# Patient Record
Sex: Female | Born: 1965 | Race: White | Hispanic: No | Marital: Married | Smoking: Former smoker
Health system: Southern US, Community
[De-identification: ages and names within clinical notes are randomized; demographics above are authoritative.]

## PROBLEM LIST (undated history)

## (undated) DIAGNOSIS — S82009A Unspecified fracture of unspecified patella, initial encounter for closed fracture: Secondary | ICD-10-CM

## (undated) DIAGNOSIS — G43909 Migraine, unspecified, not intractable, without status migrainosus: Secondary | ICD-10-CM

## (undated) DIAGNOSIS — L280 Lichen simplex chronicus: Secondary | ICD-10-CM

## (undated) DIAGNOSIS — N870 Mild cervical dysplasia: Secondary | ICD-10-CM

## (undated) HISTORY — PX: BREAST SURGERY: SHX581

## (undated) HISTORY — DX: Migraine, unspecified, not intractable, without status migrainosus: G43.909

## (undated) HISTORY — PX: BREAST BIOPSY: SHX20

## (undated) HISTORY — PX: GYNECOLOGIC CRYOSURGERY: SHX857

## (undated) HISTORY — DX: Unspecified fracture of unspecified patella, initial encounter for closed fracture: S82.009A

## (undated) HISTORY — DX: Mild cervical dysplasia: N87.0

## (undated) HISTORY — DX: Lichen simplex chronicus: L28.0

---

## 1990-11-14 DIAGNOSIS — N870 Mild cervical dysplasia: Secondary | ICD-10-CM

## 1990-11-14 HISTORY — DX: Mild cervical dysplasia: N87.0

## 1999-10-20 ENCOUNTER — Inpatient Hospital Stay (HOSPITAL_COMMUNITY): Admission: AD | Admit: 1999-10-20 | Discharge: 1999-10-20 | Payer: Self-pay | Admitting: Obstetrics and Gynecology

## 2000-01-03 ENCOUNTER — Inpatient Hospital Stay (HOSPITAL_COMMUNITY): Admission: AD | Admit: 2000-01-03 | Discharge: 2000-01-05 | Payer: Self-pay | Admitting: *Deleted

## 2000-02-14 ENCOUNTER — Other Ambulatory Visit: Admission: RE | Admit: 2000-02-14 | Discharge: 2000-02-14 | Payer: Self-pay | Admitting: Obstetrics and Gynecology

## 2002-06-15 ENCOUNTER — Encounter: Payer: Self-pay | Admitting: Emergency Medicine

## 2002-06-15 ENCOUNTER — Emergency Department (HOSPITAL_COMMUNITY): Admission: EM | Admit: 2002-06-15 | Discharge: 2002-06-15 | Payer: Self-pay | Admitting: Emergency Medicine

## 2003-09-04 ENCOUNTER — Other Ambulatory Visit: Admission: RE | Admit: 2003-09-04 | Discharge: 2003-09-04 | Payer: Self-pay | Admitting: Obstetrics and Gynecology

## 2005-07-26 ENCOUNTER — Encounter: Admission: RE | Admit: 2005-07-26 | Discharge: 2005-07-26 | Payer: Self-pay | Admitting: Obstetrics and Gynecology

## 2005-08-05 ENCOUNTER — Encounter: Admission: RE | Admit: 2005-08-05 | Discharge: 2005-08-05 | Payer: Self-pay | Admitting: Obstetrics and Gynecology

## 2005-08-05 ENCOUNTER — Other Ambulatory Visit: Admission: RE | Admit: 2005-08-05 | Discharge: 2005-08-05 | Payer: Self-pay | Admitting: Obstetrics and Gynecology

## 2006-08-18 ENCOUNTER — Encounter: Admission: RE | Admit: 2006-08-18 | Discharge: 2006-08-18 | Payer: Self-pay | Admitting: Obstetrics and Gynecology

## 2006-08-24 ENCOUNTER — Other Ambulatory Visit: Admission: RE | Admit: 2006-08-24 | Discharge: 2006-08-24 | Payer: Self-pay | Admitting: Obstetrics & Gynecology

## 2007-08-21 ENCOUNTER — Encounter: Admission: RE | Admit: 2007-08-21 | Discharge: 2007-08-21 | Payer: Self-pay | Admitting: Obstetrics and Gynecology

## 2008-01-10 ENCOUNTER — Other Ambulatory Visit: Admission: RE | Admit: 2008-01-10 | Discharge: 2008-01-10 | Payer: Self-pay | Admitting: Obstetrics & Gynecology

## 2008-09-09 ENCOUNTER — Encounter: Admission: RE | Admit: 2008-09-09 | Discharge: 2008-09-09 | Payer: Self-pay | Admitting: Obstetrics & Gynecology

## 2009-09-10 ENCOUNTER — Encounter: Admission: RE | Admit: 2009-09-10 | Discharge: 2009-09-10 | Payer: Self-pay | Admitting: Obstetrics & Gynecology

## 2009-09-16 ENCOUNTER — Encounter: Admission: RE | Admit: 2009-09-16 | Discharge: 2009-09-16 | Payer: Self-pay | Admitting: Orthopedic Surgery

## 2010-12-05 ENCOUNTER — Encounter: Payer: Self-pay | Admitting: Obstetrics & Gynecology

## 2011-04-01 NOTE — Op Note (Signed)
   NAMEDLYNN, RANES                          ACCOUNT NO.:  [id number]   MEDICAL RECORD NO.:  [id number]                   PATIENT TYPE:  EMS   LOCATION:  MAJO                                 FACILITY:  [facility]   PHYSICIAN:  Nicki Reaper, M.D.                 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   PROCEDURE:  Revision with advancement of volar fat pad left middle finger.   SURGEON:  Nicki Reaper, M.D.   ANESTHESIA:  Metacarpal block.   INDICATIONS FOR PROCEDURE:  The patient is a 45 year old female who put her  hand in a Cuisinart, suffering an amputation of the tip of the left middle  finger.  X-ray revealed the amputation to the tip, this was transverse in  nature.   DESCRIPTION OF PROCEDURE:  The patient was given a metacarpal block with 1%  Xylocaine without epinephrine and prepped using Betadine solution.  A  Penrose drain was used for tourniquet control at the base of the finger.  The volar skin was intact, however, it was felt that an advancement of  subcutaneous tissue over the tip with healing would provide a better longer  tip, rather than VY advancement.  As such, the subcutaneous tissue was  undermined.  This was easily advanced over the bone and sutured to the  nailbed with interrupted 5-0 chromic sutures.  Further chromics were placed  to bring the tuft into better alignment.  The wound was irrigated prior to  closure.  A sterile compressive dressing and splint was applied.  She will  return in a week to the office for whirlpool and allow this to granulate and  reepithelialize.  She is discharged on Vicodin and Keflex.                                               Nicki Reaper, M.D.    GRK/MEDQ  D:  06/15/2002  T:  06/20/2002   Job:  [id number]

## 2012-09-27 ENCOUNTER — Other Ambulatory Visit: Payer: Self-pay | Admitting: Family Medicine

## 2012-09-27 ENCOUNTER — Other Ambulatory Visit: Payer: Self-pay | Admitting: Obstetrics & Gynecology

## 2012-09-27 DIAGNOSIS — Z1231 Encounter for screening mammogram for malignant neoplasm of breast: Secondary | ICD-10-CM

## 2012-11-12 ENCOUNTER — Ambulatory Visit
Admission: RE | Admit: 2012-11-12 | Discharge: 2012-11-12 | Disposition: A | Discharge status: Home / Self Care | Payer: Managed Care, Other (non HMO) | Source: Ambulatory Visit | Attending: Family Medicine | Admitting: Family Medicine

## 2012-11-12 DIAGNOSIS — Z1231 Encounter for screening mammogram for malignant neoplasm of breast: Secondary | ICD-10-CM

## 2013-01-30 ENCOUNTER — Telehealth: Payer: Self-pay | Admitting: Obstetrics & Gynecology

## 2013-01-30 MED ORDER — RIZATRIPTAN BENZOATE 5 MG PO TBDP: 5.0000 mg | ORAL_TABLET | ORAL | Status: DC | PRN

## 2013-01-30 MED ORDER — ESTRADIOL 0.05 MG/24HR TD PTTW: 1.0000 | MEDICATED_PATCH | TRANSDERMAL | Status: DC

## 2013-01-30 MED ORDER — PROGESTERONE MICRONIZED 200 MG PO CAPS: 200.0000 mg | ORAL_CAPSULE | Freq: Every day | ORAL | Status: DC

## 2013-01-30 NOTE — Telephone Encounter (Signed)
Called patient personally.  Out of Vivelle dot patches.  Last one used was on Wednesday.  Has a migraine today.  This is a typical migraine for her.  She was on a trip last week.  We tried to call her three times to verify the medications she was taking.  She is aware I personally called the prescriptions to pharmacy on chart.  Vivelle 0.05mg  and Prometrium 200mg .  Also called in RX for Maxalt MLT.  Patient aware how to use this for HA.  Precautions given for going to urgent care or ER if headache does not subside or seems different in any way to her.  Voices understanding.

## 2013-01-30 NOTE — Telephone Encounter (Signed)
Pt. has a severe migraine and vomiting/pt has been without hormones for four days/husband left work to help her/RX was supposed to be called in per husband/[place]

## 2013-01-30 NOTE — Telephone Encounter (Signed)
SEE NOTE BELOW. PLEASE ADVISE. ?MEDICAITON  ?   Jordan Graham

## 2013-01-31 ENCOUNTER — Other Ambulatory Visit: Payer: Self-pay | Admitting: Obstetrics & Gynecology

## 2013-01-31 MED ORDER — ESTRADIOL 0.05 MG/24HR TD PTTW: 1.0000 | MEDICATED_PATCH | TRANSDERMAL | Status: DC

## 2013-01-31 NOTE — Telephone Encounter (Signed)
Called patient.  She put on two patches last night--I had advised her to do this.  She will take one off 48 hours after placement.  Did not need to use the Maxalt MLT.  Her headache is completely gone and she states she feels like a new person.  Very appreciative of my call.  Sample for Vivelle dot 0.05mg  left at front desk for her to pick up.

## 2013-02-05 ENCOUNTER — Encounter: Payer: Self-pay | Admitting: Obstetrics & Gynecology

## 2013-02-05 DIAGNOSIS — G43909 Migraine, unspecified, not intractable, without status migrainosus: Secondary | ICD-10-CM | POA: Insufficient documentation

## 2013-10-18 ENCOUNTER — Telehealth: Payer: Self-pay | Admitting: *Deleted

## 2013-10-18 MED ORDER — PROGESTERONE MICRONIZED 200 MG PO CAPS: 200.0000 mg | ORAL_CAPSULE | Freq: Every day | ORAL | Status: DC

## 2013-10-18 NOTE — Telephone Encounter (Signed)
Rx  Refill request from [facility] for Prometrium 200 mg, take 1 capsule days 1-15 Last annual exam 09/27/12, last mammo 11/12/12 (neg). Annual exam scheduled for 12/2013 Spoke with patient she did not get her Rx for November when she went to pick it up She was told Rx had expired. Advised patient that she would need to have her mammo Done before she comes in for her annual exam 12/2013. Pt was ok with this.

## 2013-11-01 ENCOUNTER — Encounter: Payer: Self-pay | Admitting: Podiatrist

## 2013-11-01 ENCOUNTER — Ambulatory Visit (INDEPENDENT_AMBULATORY_CARE_PROVIDER_SITE_OTHER): Payer: Managed Care, Other (non HMO) | Admitting: Podiatrist

## 2013-11-01 ENCOUNTER — Ambulatory Visit (INDEPENDENT_AMBULATORY_CARE_PROVIDER_SITE_OTHER): Payer: Managed Care, Other (non HMO)

## 2013-11-01 VITALS — BP 132/88 | HR 87 | Resp 16 | Ht 66.0 in | Wt 182.0 lb

## 2013-11-01 DIAGNOSIS — R52 Pain, unspecified: Secondary | ICD-10-CM

## 2013-11-01 DIAGNOSIS — M659 Synovitis and tenosynovitis, unspecified: Secondary | ICD-10-CM

## 2013-11-01 DIAGNOSIS — M7750 Other enthesopathy of unspecified foot: Secondary | ICD-10-CM

## 2013-11-01 NOTE — Patient Instructions (Signed)

## 2013-11-01 NOTE — Progress Notes (Signed)
   Subjective:    Patient ID: Jordan Graham, female    DOB: [birth year], 47 y.o.   MRN: [id number]  "My left foot is very painful from here to here for about 3 months.  It's even more painful the day after I do exercises."  Foot Pain This is a new (5th Metatarsal/ lateral Pain Left) problem. Episode onset: 3 months. The problem occurs constantly. The problem has been gradually worsening. Associated symptoms include fatigue and numbness. Exacerbated by: put pressure on it, touch. Treatments tried: new shoes, wear tennis shoes more frequently or keep my Dansko shoes on. The treatment provided mild relief.      Review of Systems  Constitutional: Positive for fatigue.  HENT: Positive for sinus pressure.   Musculoskeletal: Positive for back pain and gait problem.       Joint pain  Neurological: Positive for numbness.  All other systems reviewed and are negative.       Objective:   Physical Exam  GENERAL APPEARANCE: Alert, conversant. Appropriately groomed. No acute distress.  VASCULAR: Pedal pulses palpable and strong bilateral.  Capillary refill time is immediate to all digits,  Proximal to distal cooling it warm to warm.  Digital hair growth is present bilateral  NEUROLOGIC: sensation is intact epicritically and protectively to 5.07 monofilament at 5/5 sites bilateral.  Light touch is intact bilateral, vibratory sensation intact bilateral, achilles tendon reflex is intact bilateral.  MUSCULOSKELETAL: Metatarsus adductus foot type is noted. Pain along the peroneus brevis and longus tendon at their insertion is noted. Generalized lateral foot discomfort is present.  DERMATOLOGIC: skin color, texture, and turger are within normal limits.  No preulcerative lesions are seen, no interdigital maceration noted.  No open lesions present.  Digital nails are asymptomatic.      Assessment & Plan:  Peroneal tendinitis left foot Plan: Injected the area near the peroneus longus and brevis tendon  with dexamethasone and Marcaine mixture without complication. Removable plantar fascial strapping was applied and the patient was instructed on rest ice compression and elevation. If the injection is of no benefit we will put her in a boot to take the pressure off of this foot. She will call if the injection is of no benefit and we will put her for a boot

## 2013-11-21 ENCOUNTER — Other Ambulatory Visit: Payer: Self-pay

## 2013-11-21 DIAGNOSIS — Z1231 Encounter for screening mammogram for malignant neoplasm of breast: Secondary | ICD-10-CM

## 2013-12-12 ENCOUNTER — Ambulatory Visit
Admission: RE | Admit: 2013-12-12 | Discharge: 2013-12-12 | Disposition: A | Discharge status: Home / Self Care | Payer: 59 | Source: Ambulatory Visit

## 2013-12-12 DIAGNOSIS — Z1231 Encounter for screening mammogram for malignant neoplasm of breast: Secondary | ICD-10-CM

## 2013-12-18 ENCOUNTER — Other Ambulatory Visit: Payer: Self-pay | Admitting: Obstetrics & Gynecology

## 2013-12-18 NOTE — Telephone Encounter (Signed)
Last refilled: 10/28/13 #15/0 refills  Last AEX: 09/27/12 Current AEX: 12/26/13  Okay to refill? Please Advise.

## 2013-12-24 ENCOUNTER — Encounter: Payer: Self-pay | Admitting: Obstetrics & Gynecology

## 2013-12-26 ENCOUNTER — Ambulatory Visit (INDEPENDENT_AMBULATORY_CARE_PROVIDER_SITE_OTHER): Payer: Managed Care, Other (non HMO) | Admitting: Obstetrics & Gynecology

## 2013-12-26 ENCOUNTER — Encounter: Payer: Self-pay | Admitting: Obstetrics & Gynecology

## 2013-12-26 VITALS — BP 124/84 | HR 64 | Resp 16 | Ht 65.75 in | Wt 186.2 lb

## 2013-12-26 DIAGNOSIS — R1310 Dysphagia, unspecified: Secondary | ICD-10-CM

## 2013-12-26 DIAGNOSIS — B9689 Other specified bacterial agents as the cause of diseases classified elsewhere: Secondary | ICD-10-CM

## 2013-12-26 DIAGNOSIS — A499 Bacterial infection, unspecified: Secondary | ICD-10-CM

## 2013-12-26 DIAGNOSIS — N760 Acute vaginitis: Secondary | ICD-10-CM

## 2013-12-26 DIAGNOSIS — Z01419 Encounter for gynecological examination (general) (routine) without abnormal findings: Secondary | ICD-10-CM

## 2013-12-26 DIAGNOSIS — Z0000 Encounter for general adult medical examination without abnormal findings: Secondary | ICD-10-CM

## 2013-12-26 DIAGNOSIS — Z23 Encounter for immunization: Secondary | ICD-10-CM

## 2013-12-26 LAB — POCT URINALYSIS DIPSTICK
BILIRUBIN UA: NEGATIVE
GLUCOSE UA: NEGATIVE
Ketones, UA: NEGATIVE
Nitrite, UA: NEGATIVE
PROTEIN UA: NEGATIVE
Urobilinogen, UA: NEGATIVE
pH, UA: 5

## 2013-12-26 LAB — HEMOGLOBIN, FINGERSTICK: Hemoglobin, fingerstick: 13.8 g/dL (ref 12.0–16.0)

## 2013-12-26 MED ORDER — ESTRADIOL 0.05 MG/24HR TD PTTW: 1.0000 | MEDICATED_PATCH | TRANSDERMAL | Status: DC

## 2013-12-26 MED ORDER — FLUCONAZOLE 150 MG PO TABS: 150.0000 mg | ORAL_TABLET | Freq: Once | ORAL | Status: DC

## 2013-12-26 MED ORDER — METRONIDAZOLE 0.75 % VA GEL: 1.0000 | Freq: Every day | VAGINAL | Status: DC

## 2013-12-26 MED ORDER — PROGESTERONE MICRONIZED 200 MG PO CAPS: ORAL_CAPSULE | ORAL | Status: DC

## 2013-12-26 NOTE — Progress Notes (Signed)
48 y.o. G2P2 MarriedCaucasianF here for annual exam.  Has a placed on left arm that she wants me to feel.  Non tender.  Not in axilla.  Cycles area regular.  Tetanus shot is due.  Will do today.  Cycles have been fairly consistent.  About every six weeks.  Flow is getting shorter.  Last few have been flow for two days and then spotting for two days.  Reports she feels trouble swallowing at times, like food is getting caught in her throat.  New to her.  Patient's last menstrual period was 12/17/2013.          Sexually active: yes  The current method of family planning is vasectomy.    Exercising: yes  walking, elliptical, and body pump Smoker:  Former smoker-in college  Health Maintenance: Pap:  09/27/12 WNL/negative HR HPV History of abnormal Pap:  no MMG:  12/12/13 3D normal Colonoscopy:  11/12 repeat in 7 years BMD:   none TDaP:  8/04 Screening Labs: 2013, Hb today: 13.8, Urine today: WBC-trace, RBC-trace   reports that she quit smoking about 19 years ago. She has never used smokeless tobacco. She reports that she drinks alcohol. She reports that she does not use illicit drugs.  Past Medical History  Diagnosis Date  . Lichen simplex chronicus   . CIN I (cervical intraepithelial neoplasia I) 1992    tx with cryo  . Migraine     with menses    Past Surgical History  Procedure Laterality Date  . Breast surgery    . Gynecologic cryosurgery      Current Outpatient Prescriptions  Medication Sig Dispense Refill  . atomoxetine (STRATTERA) 80 MG capsule Take 100 mg by mouth daily.       [name] estradiol (VIVELLE-DOT) 0.05 MG/24HR Place 1 patch (0.05 mg total) onto the skin 2 (two) times a week.  3 patch  0  . progesterone (PROMETRIUM) 200 MG capsule TAKE ONE CAPSULE BY MOUTH EVERY DAY FOR 15 DAYS EACH MONTH STARTING ON THE FIRST DAY OF EACH MONTH  15 capsule  0  . sertraline (ZOLOFT) 100 MG tablet Take 150 mg by mouth daily.        No current facility-administered medications for this  visit.    Family History  Problem Relation Age of Onset  . Thyroid cancer Father 56  . Diabetes Father   . Hypertension Father   . Bladder Cancer Maternal Grandmother   . Heart attack Maternal Grandmother 72    deceased  . Hypertension Mother   . Pulmonary disease Maternal Grandmother   . Pulmonary disease Paternal Grandfather   . Diverticulitis Paternal Grandmother   . Migraines Mother   . Migraines Brother   . Other Paternal Aunt     mass found during endoscopy-deceased 2 days after procedure while in the hospital    ROS:  Pertinent items are noted in HPI.  Otherwise, a comprehensive ROS was negative.  Exam:   LMP 12/17/2013   Ht Readings from Last 3 Encounters:  11/01/13 5\' 6"  (1.676 m)    General appearance: alert, cooperative and appears stated age Head: Normocephalic, without obvious abnormality, atraumatic Neck: no adenopathy, supple, symmetrical, trachea midline and thyroid normal to inspection and palpation Lungs: clear to auscultation bilaterally Breasts: normal appearance, no masses or tenderness Heart: regular rate and rhythm Abdomen: soft, non-tender; bowel sounds normal; no masses,  no organomegaly Extremities: extremities normal, atraumatic, no cyanosis or edema Skin: Skin color, texture, turgor normal. No rashes or  lesions Lymph nodes: Cervical, supraclavicular, and axillary nodes normal. No abnormal inguinal nodes palpated Neurologic: Grossly normal   Pelvic: External genitalia:  no lesions              Urethra:  normal appearing urethra with no masses, tenderness or lesions              Bartholins and Skenes: normal                 Vagina: normal appearing vagina with normal color, watery discharge with fishy odor, no lesions              Cervix: no lesions              Pap taken: no Bimanual Exam:  Uterus:  normal size, contour, position, consistency, mobility, non-tender              Adnexa: normal adnexa and no mass, fullness, tenderness                Rectovaginal: Confirms               Anus:  normal sphincter tone, no lesions  Wet prep:  Ph 5.0.  KOH with + Whiff, no yeast.  Saline with + clue wells, no trich  A:  Well Woman with normal exam On HRT New onset of difficulty swallowing Vulvar lichen simplex chronicus/lichen sclerosus--no new issues/no itching BV  P:   Mammogram yearly pap smear not indicated.  Neg with Neg HR HPV 2013 TSH, CMP Thyroid u/s to be scheduled Metrogel rx to pharmacy Continue Vivelle dot and Prometrium rx to pharmacy Tdap due return annually or prn  An After Visit Summary was printed and given to the patient.

## 2013-12-27 LAB — COMPREHENSIVE METABOLIC PANEL
ALK PHOS: 62 U/L (ref 39–117)
ALT: 16 U/L (ref 0–35)
AST: 17 U/L (ref 0–37)
Albumin: 4.6 g/dL (ref 3.5–5.2)
BUN: 15 mg/dL (ref 6–23)
CHLORIDE: 103 meq/L (ref 96–112)
CO2: 28 mEq/L (ref 19–32)
CREATININE: 0.57 mg/dL (ref 0.50–1.10)
Calcium: 9.5 mg/dL (ref 8.4–10.5)
GLUCOSE: 83 mg/dL (ref 70–99)
POTASSIUM: 4.3 meq/L (ref 3.5–5.3)
SODIUM: 140 meq/L (ref 135–145)
Total Bilirubin: 0.3 mg/dL (ref 0.2–1.2)
Total Protein: 7 g/dL (ref 6.0–8.3)

## 2013-12-27 LAB — TSH: TSH: 2.13 u[IU]/mL (ref 0.350–4.500)

## 2014-01-03 ENCOUNTER — Telehealth: Payer: Self-pay | Admitting: *Deleted

## 2014-01-03 NOTE — Progress Notes (Signed)
LMTCB to discuss lab results and scheduling thyroid U/S.

## 2014-01-03 NOTE — Addendum Note (Signed)
Addended by: Joeseph AmorFAST, Anastazja Isaac L on: 01/03/2014 09:11 AM   Modules accepted: Orders

## 2014-01-03 NOTE — Telephone Encounter (Signed)
[facility] patient [facility] @ 1315. Patient agreeable.

## 2014-01-08 ENCOUNTER — Ambulatory Visit
Admission: RE | Admit: 2014-01-08 | Discharge: 2014-01-08 | Disposition: A | Discharge status: Home / Self Care | Payer: 59 | Source: Ambulatory Visit | Attending: Obstetrics & Gynecology | Admitting: Obstetrics & Gynecology

## 2014-01-08 DIAGNOSIS — R1310 Dysphagia, unspecified: Secondary | ICD-10-CM

## 2014-01-13 ENCOUNTER — Telehealth: Payer: Self-pay | Admitting: Emergency Medicine

## 2014-01-13 NOTE — Telephone Encounter (Signed)
Message left to return call to [facility] at [phone] for a message from Dr. Hyacinth MeekerMiller.   Left message to advise of normal results of ultrasound, okay per release of information. Advised if continues with dificulty swallowing, to call us and next step is referral to ENT.

## 2014-01-13 NOTE — Telephone Encounter (Signed)
Message copied by Joeseph AmorFAST, Carlissa Pesola L on Mon Jan 13, 2014  8:26 AM ------      Message from: Jerene BearsMILLER, MARY S      Created: Thu Jan 09, 2014  9:45 PM       Please inform Mrs. Mendibles thyroid is normal except for a very small, 4mm nodule.  Nothing needs to be done about this.  If the trouble swallowing continues, we need to refer her to ENT. ------

## 2014-01-28 ENCOUNTER — Other Ambulatory Visit: Payer: Self-pay | Admitting: *Deleted

## 2014-01-28 NOTE — Telephone Encounter (Signed)
Error

## 2014-06-27 ENCOUNTER — Telehealth: Payer: Self-pay | Admitting: Obstetrics & Gynecology

## 2014-06-27 NOTE — Telephone Encounter (Addendum)
[facility] F[facility]ed back with note: Rx sent 12/26/13 #24 patch/ 4 refills. She should have enough refills to last until 12/2014

## 2014-06-27 NOTE — Telephone Encounter (Signed)
Pt requesting refill on estradiol sent to [facility] at [phone].

## 2014-06-30 NOTE — Telephone Encounter (Signed)
S/w pharmacist they do have rx from 12/26/13 and patient has already picked up a refill.  Routed to provider for review, encounter closed.

## 2014-09-15 ENCOUNTER — Encounter: Payer: Self-pay | Admitting: Obstetrics & Gynecology

## 2014-10-28 ENCOUNTER — Other Ambulatory Visit: Payer: Self-pay | Admitting: Nurse Practitioner

## 2014-11-06 ENCOUNTER — Emergency Department (HOSPITAL_COMMUNITY): Payer: Medicare HMO

## 2014-11-06 ENCOUNTER — Encounter (HOSPITAL_COMMUNITY): Payer: Self-pay

## 2014-11-06 ENCOUNTER — Emergency Department (HOSPITAL_COMMUNITY)
Admission: EM | Admit: 2014-11-06 | Discharge: 2014-11-06 | Disposition: A | Discharge status: Home / Self Care | Payer: Medicare HMO | Attending: Emergency Medicine | Admitting: Emergency Medicine

## 2014-11-06 DIAGNOSIS — Y9301 Activity, walking, marching and hiking: Secondary | ICD-10-CM | POA: Insufficient documentation

## 2014-11-06 DIAGNOSIS — W010XXA Fall on same level from slipping, tripping and stumbling without subsequent striking against object, initial encounter: Secondary | ICD-10-CM | POA: Diagnosis not present

## 2014-11-06 DIAGNOSIS — Z8669 Personal history of other diseases of the nervous system and sense organs: Secondary | ICD-10-CM | POA: Diagnosis not present

## 2014-11-06 DIAGNOSIS — Z79899 Other long term (current) drug therapy: Secondary | ICD-10-CM | POA: Insufficient documentation

## 2014-11-06 DIAGNOSIS — S82031A Displaced transverse fracture of right patella, initial encounter for closed fracture: Secondary | ICD-10-CM | POA: Diagnosis not present

## 2014-11-06 DIAGNOSIS — Y998 Other external cause status: Secondary | ICD-10-CM | POA: Insufficient documentation

## 2014-11-06 DIAGNOSIS — S82001A Unspecified fracture of right patella, initial encounter for closed fracture: Secondary | ICD-10-CM

## 2014-11-06 DIAGNOSIS — Y92410 Unspecified street and highway as the place of occurrence of the external cause: Secondary | ICD-10-CM | POA: Insufficient documentation

## 2014-11-06 DIAGNOSIS — S8991XA Unspecified injury of right lower leg, initial encounter: Secondary | ICD-10-CM | POA: Diagnosis present

## 2014-11-06 DIAGNOSIS — Z8742 Personal history of other diseases of the female genital tract: Secondary | ICD-10-CM | POA: Diagnosis not present

## 2014-11-06 DIAGNOSIS — Z872 Personal history of diseases of the skin and subcutaneous tissue: Secondary | ICD-10-CM | POA: Diagnosis not present

## 2014-11-06 DIAGNOSIS — Z87891 Personal history of nicotine dependence: Secondary | ICD-10-CM | POA: Insufficient documentation

## 2014-11-06 DIAGNOSIS — S82009A Unspecified fracture of unspecified patella, initial encounter for closed fracture: Secondary | ICD-10-CM

## 2014-11-06 HISTORY — DX: Unspecified fracture of unspecified patella, initial encounter for closed fracture: S82.009A

## 2014-11-06 MED ORDER — IBUPROFEN 600 MG PO TABS: 600.0000 mg | ORAL_TABLET | Freq: Four times a day (QID) | ORAL | Status: DC | PRN

## 2014-11-06 MED ORDER — HYDROCODONE-ACETAMINOPHEN 5-325 MG PO TABS: 1.0000 | ORAL_TABLET | Freq: Four times a day (QID) | ORAL | Status: DC | PRN

## 2014-11-06 NOTE — ED Provider Notes (Signed)
CSN: [id number]     Arrival date & time 11/06/14  1710 History  This chart was scribed for non-physician practitioner, Junius Finner, PA-C,working with Mirian Mo, MD, by Karle Plumber, ED Scribe. This patient was seen in room TR06C/TR06C and the patient's care was started at 5:37 PM.  Chief Complaint  Patient presents with  . Knee Pain   Patient is a 48 y.o. female presenting with knee pain. The history is provided by the patient. No language interpreter was used.  Knee Pain   HPI Comments:  ORIS CALMES is a 48 y.o. female who presents to the Emergency Department complaining of moderate right anterior knee pain secondary to falling approximately one hour ago. Pt reports associated redness and swelling of the knee. She states she was crossing a street and tripped and fell with all her weight to the right knee. Bearing weight and extension of the knee makes the pain worse. Resting the knee helps to alleviate the pain. She has not taken anything to treat her pain. Denies numbness, tingling or weakness of the RLE. Denies head injury, LOC, right ankle or hip injury or any other injury.  Past Medical History  Diagnosis Date  . Lichen simplex chronicus   . CIN I (cervical intraepithelial neoplasia I) 1992    tx with cryo  . Migraine     with menses   Past Surgical History  Procedure Laterality Date  . Breast surgery    . Gynecologic cryosurgery     Family History  Problem Relation Age of Onset  . Thyroid cancer Father 22  . Diabetes Father   . Hypertension Father   . Bladder Cancer Maternal Grandmother   . Heart attack Maternal Grandmother 72    deceased  . Hypertension Mother   . Pulmonary disease Maternal Grandmother   . Pulmonary disease Paternal Grandfather   . Diverticulitis Paternal Grandmother   . Migraines Mother   . Migraines Brother   . Other Paternal Aunt     mass found during endoscopy-deceased 2 days after procedure while in the hospital   History   Substance Use Topics  . Smoking status: Former Smoker    Quit date: 11/14/1994  . Smokeless tobacco: Never Used  . Alcohol Use: Yes     Comment: wine-occ   OB History    Gravida Para Term Preterm AB TAB SAB Ectopic Multiple Living   2 2        2       Obstetric Comments   And 3 stepchildren     Review of Systems  Musculoskeletal: Positive for joint swelling.  Neurological: Negative for syncope, weakness and numbness.  All other systems reviewed and are negative.   Allergies  Review of patient's allergies indicates no known allergies.  Home Medications   Prior to Admission medications   Medication Sig Start Date End Date Taking? Authorizing Provider  atomoxetine (STRATTERA) 80 MG capsule Take 100 mg by mouth daily.    Yes Historical Provider, MD  estradiol (VIVELLE-DOT) 0.05 MG/24HR patch Place 1 patch (0.05 mg total) onto the skin 2 (two) times a week. 12/26/13  Yes Annamaria Boots, MD  progesterone (PROMETRIUM) 200 MG capsule TAKE ONE CAPSULE BY MOUTH EVERY DAY FOR 15 DAYS EACH MONTH STARTING ON THE FIRST DAY OF [facility] MONTH 12/26/13  Yes Annamaria Boots, MD  sertraline (ZOLOFT) 100 MG tablet Take 150 mg by mouth daily.    Yes Historical Provider, MD  fluconazole (DIFLUCAN) 150 MG tablet Take  1 tablet (150 mg total) by mouth once. Repeat in 72 hours Patient not taking: Reported on 11/06/2014 12/26/13   Annamaria BootsMary Suzanne Miller, MD  HYDROcodone-acetaminophen (NORCO/VICODIN) 5-325 MG per tablet Take 1-2 tablets by mouth every 6 (six) hours as needed for moderate pain or severe pain. 11/06/14   Junius FinnerErin O'Malley, PA-C  ibuprofen (ADVIL,MOTRIN) 600 MG tablet Take 1 tablet (600 mg total) by mouth every 6 (six) hours as needed. 11/06/14   Junius FinnerErin O'Malley, PA-C  metroNIDAZOLE (METROGEL) 0.75 % vaginal gel Place 1 Applicatorful vaginally at bedtime. Use for 5 days Patient not taking: Reported on 11/06/2014 12/26/13   Annamaria BootsMary Suzanne Miller, MD   Triage Vitals: BP 136/90 mmHg  Pulse 97   Temp(Src) 98.2 F (36.8 C) (Oral)  Resp 18  SpO2 100%  LMP 11/03/2014 (Exact Date) Physical Exam  Constitutional: She is oriented to person, place, and time. She appears well-developed and well-nourished.  HENT:  Head: Normocephalic and atraumatic.  Eyes: EOM are normal.  Neck: Normal range of motion.  Cardiovascular: Normal rate.   DP pulses intact.  Pulmonary/Chest: Effort normal.  Musculoskeletal: Normal range of motion. She exhibits tenderness. She exhibits no edema.  Right knee with no obvious deformity, no edema. Erythema and ecchymosis over patella with tenderness. No tender to medial or lateral aspect of knee. Unable to bear weight. Increased pain with full knee extension.  Neurological: She is alert and oriented to person, place, and time.  Sensations intact.  Skin: Skin is warm and dry.  Skin in tact.  Psychiatric: She has a normal mood and affect. Her behavior is normal.  Nursing note and vitals reviewed.   ED Course  Procedures (including critical care time) DIAGNOSTIC STUDIES: Oxygen Saturation is 100% on RA, normal by my interpretation.   COORDINATION OF CARE: 5:40 PM- Will X-Ray right knee. Offered pain medication but pt declined. Pt verbalizes understanding and agrees to plan.  Medications - No data to display  Labs Review Labs Reviewed - No data to display  Imaging Review Dg Knee Complete 4 Views Right  11/06/2014   CLINICAL DATA:  Tripped and fell while walking across street, anterior RIGHT knee pain  EXAM: RIGHT KNEE - COMPLETE 4+ VIEW  COMPARISON:  None  FINDINGS: Question mild osseous demineralization.  Joint spaces preserved.  Transverse fracture at inferior pole patella, distracted 3 mm.  No additional fracture, dislocation or bone destruction.  No knee joint effusion.  IMPRESSION: Transverse fracture inferior pole RIGHT patella distracted 3 mm.   Electronically Signed   By: Ulyses SouthwardMark  Boles M.D.   On: 11/06/2014 19:05     EKG Interpretation None       MDM   Final diagnoses:  Right patella fracture, closed, initial encounter  Fall from slip, trip, or stumble, initial encounter    Pt c/o right knee pain after trip and fall just PTA. Denies hitting head or LOC. No other injuries. Right leg is neurovascularly in tact. Tenderness over patella.  No medial or lateral joint tenderness. No edema or deformity. Unable to bear weight and severe pain with full knee extension, however able to fully extend knee.   Discussed pt with Dr. Littie DeedsGentry.  Consulted with Dr. Ophelia CharterYates, orthopedics.  Pt may be discharged home in knee immobilizer and crutches.  Pt is to call to schedule f/u appointment with Dr. Ophelia CharterYates on Tuesday, 12/29, however, pt states she has been seen by Dr. Eulah PontMurphy and Thurston HoleWainer, orthopedics in the past. Advised pt is allowed to call her previously established  orthopedist to see if she can f/u with them next week. Home care instructions provided. Rx: norco and ibuprofen. Return precautions provided. Pt verbalized understanding and agreement with tx plan.   I personally performed the services described in this documentation, which was scribed in my presence. The recorded information has been reviewed and is accurate.    Junius Finnerrin O'Malley, PA-C 11/06/14 2010  Mirian MoMatthew Gentry, MD 11/10/14 1400

## 2014-11-06 NOTE — Progress Notes (Signed)
Orthopedic Tech Progress Note Patient Details:  Vic BlackbirdVanessa P Ruppel 1966/02/28 [id number]  Ortho Devices Type of Ortho Device: Knee Immobilizer, Crutches Ortho Device/Splint Location: RLE Ortho Device/Splint Interventions: Ordered, Application   Jennye MoccasinHughes, Terion Hedman Craig 11/06/2014, 7:57 PM

## 2014-11-06 NOTE — ED Notes (Signed)
Pt states that an hour ago she was walking crossing the street and tripped and fell and her weight shifted onto her knee cap causing knee injury. Swelling noted to right knee. Pt states that pain is 3/10 at rest and 10/10 when bearing weight on right knee.

## 2014-11-06 NOTE — ED Notes (Signed)
Called Radiology about delay. They report the xray rooms are full, but she is next on the list. Updated patient and provider.

## 2014-11-17 ENCOUNTER — Telehealth: Payer: Self-pay | Admitting: Obstetrics & Gynecology

## 2014-11-17 ENCOUNTER — Other Ambulatory Visit: Payer: Self-pay

## 2014-11-17 DIAGNOSIS — Z1231 Encounter for screening mammogram for malignant neoplasm of breast: Secondary | ICD-10-CM

## 2014-11-17 NOTE — Telephone Encounter (Signed)
Pt wants to talk with nurse concerning her Vivelle dot. She picked up her prescription on 12/202015 and was charged $ 82.99 for it. She states the pharmacist told her it was because Dr. Hyacinth Meeker was listed as a nutritionist and not a MD. Patient wants to know what is going on because this is bizarre.[name]

## 2014-11-18 NOTE — Telephone Encounter (Signed)
Call to patient. Husband Jordan Graham answered line and said he knows about message, Jordan Graham gave details about issue with pharmacy. Advised I spoke with [facility] employee, Jordan Graham who has made the change under provider to Jordan BootsMary Suzanne Miller, MD and that there is a refund available. Advised them to call [facility] directly if has any questions.   Okay to speak with Jordan Graham per designated party release form.   Routing to provider for final review. Patient agreeable to disposition. Will close encounter

## 2014-12-15 ENCOUNTER — Ambulatory Visit: Payer: 59

## 2015-01-02 ENCOUNTER — Ambulatory Visit (INDEPENDENT_AMBULATORY_CARE_PROVIDER_SITE_OTHER): Payer: Managed Care, Other (non HMO) | Admitting: Obstetrics & Gynecology

## 2015-01-02 ENCOUNTER — Encounter: Payer: Self-pay | Admitting: Obstetrics & Gynecology

## 2015-01-02 VITALS — BP 118/70 | HR 60 | Resp 16 | Ht 66.0 in | Wt 185.2 lb

## 2015-01-02 DIAGNOSIS — Z0000 Encounter for general adult medical examination without abnormal findings: Secondary | ICD-10-CM

## 2015-01-02 DIAGNOSIS — Z01419 Encounter for gynecological examination (general) (routine) without abnormal findings: Secondary | ICD-10-CM

## 2015-01-02 DIAGNOSIS — Z124 Encounter for screening for malignant neoplasm of cervix: Secondary | ICD-10-CM

## 2015-01-02 LAB — POCT URINALYSIS DIPSTICK
Bilirubin, UA: NEGATIVE
Glucose, UA: NEGATIVE
KETONES UA: NEGATIVE
Leukocytes, UA: NEGATIVE
Nitrite, UA: NEGATIVE
PROTEIN UA: NEGATIVE
UROBILINOGEN UA: NEGATIVE
pH, UA: 5

## 2015-01-02 LAB — LIPID PANEL
CHOLESTEROL: 162 mg/dL (ref 0–200)
HDL: 65 mg/dL (ref >39)
LDL Cholesterol: 82 mg/dL (ref 0–99)
Total CHOL/HDL Ratio: 2.5 Ratio
Triglycerides: 74 mg/dL (ref <150)
VLDL: 15 mg/dL (ref 0–40)

## 2015-01-02 MED ORDER — ESTRADIOL 0.05 MG/24HR TD PTTW: 1.0000 | MEDICATED_PATCH | TRANSDERMAL | Status: DC

## 2015-01-02 MED ORDER — PROGESTERONE MICRONIZED 200 MG PO CAPS: ORAL_CAPSULE | ORAL | Status: DC

## 2015-01-02 NOTE — Addendum Note (Signed)
Addended by: Elisha HeadlandNIX, Keonta Monceaux S on: 01/02/2015 03:33 PM   Modules accepted: Orders, SmartSet

## 2015-01-02 NOTE — Progress Notes (Addendum)
49 y.o. G2P2 MarriedCaucasianF here for annual exam.  Cycles are less and less.  Skipped three months.  Then has three in the last 8 weeks.  Last cycle was heavy and lasted 3-4 days.  No clots.  One of these was very very light.  Reports she messed up her progesterone in December.    Pt reports she is having a lot more issues with incontinence--with sneezing, laughing, coughing.  Ready to proceed with treatment.    Patient's last menstrual period was 12/16/2014.          Sexually active: Yes.    The current method of family planning is vasectomy.    Exercising: Yes.    strength training Smoker:  Former smoker-only for 1 year  Health Maintenance: Pap:  09/27/12 WNL/negative HR HPV History of abnormal Pap:  no MMG:  12/12/13 3D-normal, scheduled this month Colonoscopy:  11/12-repeat in 7 years BMD:   none TDaP:  12/26/13 Screening Labs: lipids/tsh, Hb today: n/a, Urine today: tr leuk, tr rbc   reports that she quit smoking about 20 years ago. She has never used smokeless tobacco. She reports that she drinks alcohol. She reports that she does not use illicit drugs.  Past Medical History  Diagnosis Date  . Lichen simplex chronicus   . CIN I (cervical intraepithelial neoplasia I) 1992    tx with cryo  . Migraine     with menses    Past Surgical History  Procedure Laterality Date  . Breast surgery    . Gynecologic cryosurgery      Current Outpatient Prescriptions  Medication Sig Dispense Refill  . estradiol (VIVELLE-DOT) 0.05 MG/24HR patch Place 1 patch (0.05 mg total) onto the skin 2 (two) times a week. 24 patch 4  . progesterone (PROMETRIUM) 200 MG capsule TAKE ONE CAPSULE BY MOUTH EVERY DAY FOR 15 DAYS EACH MONTH STARTING ON THE FIRST DAY OF EACH MONTH 45 capsule 4  . sertraline (ZOLOFT) 100 MG tablet Take 150 mg by mouth daily.     [name] STRATTERA 100 MG capsule   0   No current facility-administered medications for this visit.    Family History  Problem Relation Age of Onset   . Thyroid cancer Father 7  . Diabetes Father   . Hypertension Father   . Bladder Cancer Maternal Grandmother   . Heart attack Maternal Grandmother 72    deceased  . Hypertension Mother   . Pulmonary disease Maternal Grandmother   . Pulmonary disease Paternal Grandfather   . Diverticulitis Paternal Grandmother   . Migraines Mother   . Migraines Brother   . Other Paternal Aunt     mass found during endoscopy-deceased 2 days after procedure while in the hospital    ROS:  Pertinent items are noted in HPI.  Otherwise, a comprehensive ROS was negative.  Exam:   BP 118/70 mmHg  Pulse 60  Resp 16  Ht  (1.676 m)  Wt 185 lb 3.2 oz (84.006 kg)  BMI 29.91 kg/m2  LMP 12/16/2014    Height:  (167.6 cm)  Ht Readings from Last 3 Encounters:  01/02/15  (1.676 m)  11/06/14  (1.676 m)  12/26/13 5' 5.75" (1.67 m)    General appearance: alert, cooperative and appears stated age Head: Normocephalic, without obvious abnormality, atraumatic Neck: no adenopathy, supple, symmetrical, trachea midline and thyroid normal to inspection and palpation Lungs: clear to auscultation bilaterally Breasts: normal appearance, no masses or tenderness Heart: regular rate and rhythm  Abdomen: soft, non-tender; bowel sounds normal; no masses,  no organomegaly Extremities: extremities normal, atraumatic, no cyanosis or edema Skin: Skin color, texture, turgor normal. No rashes or lesions Lymph nodes: Cervical, supraclavicular, and axillary nodes normal. No abnormal inguinal nodes palpated Neurologic: Grossly normal   Pelvic: External genitalia:  no lesions, thickened white lesion to right of clitoris and labium minora              Urethra:  normal appearing urethra with no masses, tenderness or lesions              Bartholins and Skenes: normal                 Vagina: normal appearing vagina with normal color and discharge, no lesions              Cervix: no lesions              Pap taken:  Yes.   Bimanual Exam:  Uterus:  normal size, contour, position, consistency, mobility, non-tender              Adnexa: normal adnexa and no mass, fullness, tenderness               Rectovaginal: Confirms               Anus:  normal sphincter tone, no lesions  Chaperone was present for exam.  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  Lesion stable on exam. 4mm thyroid nodule.  Family hx of thyroid cancer in her father. DUB most likely due to taking progesterone incorrectly last month. SUI  P: Mammogram yearly .  Pt knows she missed MMG appt.  Neg HR HPV with neg pap 2013.  Pap obtained today. Continue Vivelle dot and Prometrium rx to pharmacy.  Pt will call and let me know if irregular bleeding continues Repeat thyroid ultrasound one year TSH and lipids today. Plan appt with Dr. Edward JollySilva.  Will need urodynamics. return annually or prn

## 2015-01-02 NOTE — Addendum Note (Signed)
Addended by: Jerene BearsMILLER, Haille Pardi S on: 01/02/2015 02:46 PM   Modules accepted: Kipp BroodSmartSet

## 2015-01-03 LAB — TSH: TSH: 1.453 u[IU]/mL (ref 0.350–4.500)

## 2015-01-03 NOTE — Addendum Note (Signed)
Addended by: Jerene BearsMILLER, Alizey Noren S on: 01/03/2015 06:07 AM   Modules accepted: Kipp BroodSmartSet

## 2015-01-06 LAB — IPS PAP TEST WITH REFLEX TO HPV

## 2015-01-07 ENCOUNTER — Other Ambulatory Visit: Payer: Self-pay

## 2015-01-07 NOTE — Telephone Encounter (Signed)
Lmtcb//kn 

## 2015-01-07 NOTE — Telephone Encounter (Signed)
-----   Message from QuintanaBrook E Amundson de Gwenevere Ghaziarvalho E Silva, MD sent at 01/06/2015 10:24 PM EST ----- Pap is normal.  It did show bacterial vaginosis.  I usually do not treat unless patient is symptomatic or desires treatment for this.  I would offer her Flagyl 500 mg po bid for 7 nights.  Do not mix with alcohol.  No pap recall needed.

## 2015-01-08 ENCOUNTER — Other Ambulatory Visit: Payer: Self-pay | Admitting: Obstetrics & Gynecology

## 2015-01-08 MED ORDER — METRONIDAZOLE 500 MG PO TABS: 500.0000 mg | ORAL_TABLET | Freq: Two times a day (BID) | ORAL | Status: DC

## 2015-01-08 NOTE — Telephone Encounter (Signed)
Call to patient. Advised of normal TSH and cholesterol results. (see result note from Dr Hyacinth MeekerMiller). Notified of normal pap result per Dr Edward JollySilva. Also shows BV. Patient initially denied any vaginal symptoms.  Then reports she has had some light bleeding and odor since was here for appointment. Now only pink discharge. Advised should proceed with treatment of BV as Dr Edward JollySilva recommend. Alcohol precautions given while on medication and for three days following. Discussed Dr Rondel BatonMiller's recommendation for consult with Dr Edward JollySilva for SUI. Patient agreeable and appointment scheduled for 01-14-15 at 1000. Aware to expect pelvic exam. Brief explantion of possible urodynamic testing to be determined by Dr Edward JollySilva. See result noted from Dr Hyacinth MeekerMiller for these orders.

## 2015-01-14 ENCOUNTER — Encounter: Payer: Self-pay | Admitting: Obstetrics and Gynecology

## 2015-01-14 ENCOUNTER — Ambulatory Visit (INDEPENDENT_AMBULATORY_CARE_PROVIDER_SITE_OTHER): Payer: Managed Care, Other (non HMO) | Admitting: Obstetrics and Gynecology

## 2015-01-14 VITALS — BP 128/78 | HR 88 | Ht 66.0 in | Wt 184.4 lb

## 2015-01-14 DIAGNOSIS — N393 Stress incontinence (female) (male): Secondary | ICD-10-CM | POA: Diagnosis not present

## 2015-01-14 NOTE — Progress Notes (Signed)
Patient ID: Jordan Graham, female   DOB: 12-13-1965, 49 y.o.   MRN: [id number] GYNECOLOGY VISIT  PCP:  Maryelizabeth RowanElizabeth Dewey, MD  Referring provider:  Leda QuailSuzanne Miller, MD  HPI: 49 y.o.   Married  Caucasian  female   G2P2 with Patient's last menstrual period was 01/02/2015 (exact date).   here for evaluation of stress urinary incontinence.    Leaks urine with cough, laugh, yelling, and sneezing for a few years, worsening now.  Not an every day occurrence.  Leaks with exercise with jumping and trampoline.  Wears a pad.  Voids often to avoid leakage.   No leakage for no reason.  No key in lock or urge with washing hands. No urgency. DF - every 1 - 3 hours. NF - none.  No Enuresis.   Can leak as she is preparing to void.  Kegel will not hold the urine.  Position changes to empty bladder completely.   No hematuria or dysuria.  No history of UTIs or pyelonephritis or renal stones.   Vaginal deliveries - 7 pounds, 6 ounces with fourth degree tear and 7 pounds 11 ounces.  No operative vaginal delivery.   Some fecal urgency.  Some constipation issues.  No splinting.   Urine:  Negative.  GYNECOLOGIC HISTORY: Patient's last menstrual period was 01/02/2015 (exact date). Sexually active: yes  Partner preference: female Contraception:  vasectomy  Menopausal hormone therapy: n/a DES exposure: no   Blood transfusions: no   Sexually transmitted diseases:  HPV GYN procedures and prior surgeries: Lt. Breast biopsy--benign, cryotherapy to cervix in her 20's. Last mammogram: 12-13-13 fibroglandular density/nl:[facility].  Patient is scheduled for screening mammogram.               Last pap and high risk HPV testing: 01-02-15 wnl:no HPV testing--neg 2013.    History of abnormal pap smear:  Hx cryotherapy to cervix in her 3520's   OB History    Gravida Para Term Preterm AB TAB SAB Ectopic Multiple Living   2 2        2       Obstetric Comments   And 3 stepchildren        LIFESTYLE: Exercise:   Strength training           Tobacco:  no Alcohol:    no Drug use:  no  Patient Active Problem List   Diagnosis Date Noted  . Migraines 02/05/2013    Past Medical History  Diagnosis Date  . Lichen simplex chronicus   . CIN I (cervical intraepithelial neoplasia I) 1992    tx with cryo  . Migraine     with menses  . Patella fracture 11/06/14    broken patella-just released from PT    Past Surgical History  Procedure Laterality Date  . Breast surgery    . Gynecologic cryosurgery      Current Outpatient Prescriptions  Medication Sig Dispense Refill  . estradiol (VIVELLE-DOT) 0.05 MG/24HR patch Place 1 patch (0.05 mg total) onto the skin 2 (two) times a week. 24 patch 4  . metroNIDAZOLE (FLAGYL) 500 MG tablet Take 1 tablet (500 mg total) by mouth 2 (two) times daily. 14 tablet 0  . progesterone (PROMETRIUM) 200 MG capsule TAKE ONE CAPSULE BY MOUTH EVERY DAY FOR 15 DAYS EACH MONTH STARTING ON THE FIRST DAY OF EACH MONTH 45 capsule 4  . sertraline (ZOLOFT) 100 MG tablet Take 150 mg by mouth daily.     [name]. STRATTERA 100 MG capsule  0   No current facility-administered medications for this visit.     ALLERGIES: Review of patient's allergies indicates no known allergies.  Family History  Problem Relation Age of Onset  . Thyroid cancer Father 62  . Diabetes Father   . Hypertension Father   . Bladder Cancer Maternal Grandmother   . Heart attack Maternal Grandmother 72    deceased  . Hypertension Mother   . Pulmonary disease Maternal Grandmother   . Pulmonary disease Paternal Grandfather   . Diverticulitis Paternal Grandmother   . Migraines Mother   . Migraines Brother   . Other Paternal Aunt     mass found during endoscopy-deceased 2 days after procedure while in the hospital    History   Social History  . Marital Status: Married    Spouse Name: N/A  . Number of Children: N/A  . Years of Education: N/A   Occupational History  . Not on  file.   Social History Main Topics  . Smoking status: Former Smoker    Quit date: 11/14/1994  . Smokeless tobacco: Never Used  . Alcohol Use: 0.0 oz/week    0 Standard drinks or equivalent per week     Comment: maybe once a quarter  . Drug Use: No  . Sexual Activity:    Partners: Male    Birth Control/ Protection: Other-see comments     Comment: vasectomy   Other Topics Concern  . Not on file   Social History Narrative    ROS:  Pertinent items are noted in HPI.  PHYSICAL EXAMINATION:    BP 128/78 mmHg  Pulse 88  Ht  (1.676 m)  Wt 184 lb 6.4 oz (83.643 kg)  BMI 29.78 kg/m2  LMP 01/02/2015 (Exact Date)   Wt Readings from Last 3 Encounters:  01/14/15 184 lb 6.4 oz (83.643 kg)  01/02/15 185 lb 3.2 oz (84.006 kg)  11/06/14 175 lb (79.379 kg)     Ht Readings from Last 3 Encounters:  01/14/15  (1.676 m)  01/02/15  (1.676 m)  11/06/14  (1.676 m)    General appearance: alert, cooperative and appears stated age  Abdomen: soft, non-tender; no masses,  no organomegaly Extremities: extremities normal, atraumatic, no cyanosis or edema No abnormal inguinal nodes palpated Neurologic: Grossly normal  Pelvic: External genitalia:  no lesions              Urethra:  normal appearing urethra with no masses, tenderness or lesions              Bartholins and Skenes: normal                 Vagina: normal appearing vagina with normal color and discharge, no lesions              Cervix: normal appearance                 Bimanual Exam:  Uterus:  uterus is normal size, shape, consistency and nontender                                      Adnexa: normal adnexa in size, nontender and no masses                                      Rectovaginal: Confirms  Anus:  normal sphincter tone, no lesions  ASSESSMENT  Genuine stress incontinence. History of fourth degree laceration.  Some fecal urgency. Good anatomic support of the pelvic  floor.  PLAN  Comprehensive discussion of genuine stress incontinence and physical therapy versus midurethral sling/cystoscopy for treatment.   Discussion of urodynamic testing prior to surgery.  Procedure reviewed.  Patient wishes to proceed.  Midurethral sling discussed in detail including procedure itself, efficacy of 85 - 90%, and risks which include but are not limited to bleeding; infection; damage to surrounding organs; reaction to anesthesia; DVT; PE; death; cystotomy; urinary retention requiring prolonged catheterization self catheterization or loosening/surgical removal of the sling; mesh erosion and exposure leading to pain and need for surgical removal; slower voiding; de novo urinary urgency requiring medication; and urinary tract infection.   Patient understands that the material used for the midurethral sling is a permanent mesh material.  Discussion of surgical recovery and expectations.  No work for 1 - 2 weeks.  No lifting over 10 pounds and no sexual activity for 8 weeks post op.  ACOG handouts on urinary incontinence and surgical care for urinary incontinence to patient.  An After Visit Summary was printed and given to the patient.  40 minutes face to face time of which over 50% was spent in counseling.

## 2015-01-16 ENCOUNTER — Telehealth: Payer: Self-pay | Admitting: Obstetrics and Gynecology

## 2015-01-16 NOTE — Telephone Encounter (Signed)
Left message for patient to call back. Need to go over benefits for and schedule urodynamics.

## 2015-01-20 NOTE — Telephone Encounter (Signed)
Spoke with patient. Advised of oop expectation for urodynamics. Patient agreeable. Patient will call back to schedule.

## 2015-01-29 NOTE — Telephone Encounter (Signed)
Call to patient to further discuss scheduling of urodynamics. Left message for patient to call back

## 2015-02-10 NOTE — Telephone Encounter (Signed)
Made several attempts to contact patient. Phone rings busy.

## 2015-03-04 ENCOUNTER — Telehealth: Payer: Self-pay | Admitting: Obstetrics and Gynecology

## 2015-03-04 NOTE — Telephone Encounter (Signed)
Left message for patient to call back  

## 2015-03-05 NOTE — Telephone Encounter (Signed)
Spoke with patient. Patient not interested in proceeding with Urodynamics at this time. Will call back if she changes her mind.

## 2015-05-25 ENCOUNTER — Telehealth: Payer: Self-pay | Admitting: Obstetrics & Gynecology

## 2015-05-25 MED ORDER — ESTRADIOL 0.05 MG/24HR TD PTTW: 1.0000 | MEDICATED_PATCH | TRANSDERMAL | Status: DC

## 2015-05-25 MED ORDER — PROGESTERONE MICRONIZED 200 MG PO CAPS: 200.0000 mg | ORAL_CAPSULE | Freq: Every day | ORAL | Status: DC

## 2015-05-25 NOTE — Telephone Encounter (Signed)
Patient is out of town and left her medications at home. Would like to have 1 week supply of progesterone and estradiol sent to [facility] on [address] [place] at [phone]. Patient would like a call if this can be done if not she will have to come back to [place] to ger her meds.

## 2015-05-25 NOTE — Telephone Encounter (Signed)
Spoke with patient. Patient is out of town and left her Rx for Estradiol patches 0.05mg  and Progesterone 200 mg days 1-15 of each month at home. [facility] [place]. Rx for Estradiol 0.05mg  patch #8 0RF and Progesterone 200 mg #5 as patient has 5 days left of days 1-15 of the month 0RF sent to pharmacy of choice. Patient is agreeable. Aware rx for Estradiol patches comes in a box of  8 patches.  Routing to provider for final review. Patient agreeable to disposition. Will close encounter.   Patient aware provider will review message and nurse will return call if any additional advice or change of disposition.

## 2015-09-30 ENCOUNTER — Ambulatory Visit
Admission: RE | Admit: 2015-09-30 | Discharge: 2015-09-30 | Disposition: A | Discharge status: Home / Self Care | Payer: 59 | Source: Ambulatory Visit

## 2015-09-30 DIAGNOSIS — Z1231 Encounter for screening mammogram for malignant neoplasm of breast: Secondary | ICD-10-CM

## 2015-10-02 ENCOUNTER — Other Ambulatory Visit: Payer: Self-pay | Admitting: Obstetrics & Gynecology

## 2015-10-02 DIAGNOSIS — R928 Other abnormal and inconclusive findings on diagnostic imaging of breast: Secondary | ICD-10-CM

## 2015-10-12 ENCOUNTER — Ambulatory Visit
Admission: RE | Admit: 2015-10-12 | Discharge: 2015-10-12 | Disposition: A | Discharge status: Home / Self Care | Payer: 59 | Source: Ambulatory Visit | Attending: Obstetrics & Gynecology | Admitting: Obstetrics & Gynecology

## 2015-10-12 DIAGNOSIS — R928 Other abnormal and inconclusive findings on diagnostic imaging of breast: Secondary | ICD-10-CM

## 2016-03-17 ENCOUNTER — Ambulatory Visit: Payer: Managed Care, Other (non HMO) | Admitting: Obstetrics & Gynecology

## 2016-05-03 ENCOUNTER — Telehealth: Payer: Self-pay | Admitting: Obstetrics & Gynecology

## 2016-05-03 ENCOUNTER — Encounter: Payer: Self-pay | Admitting: Obstetrics & Gynecology

## 2016-05-03 ENCOUNTER — Ambulatory Visit (INDEPENDENT_AMBULATORY_CARE_PROVIDER_SITE_OTHER): Payer: Managed Care, Other (non HMO) | Admitting: Obstetrics & Gynecology

## 2016-05-03 VITALS — BP 120/66 | HR 76 | Resp 14 | Ht 66.0 in | Wt 198.4 lb

## 2016-05-03 DIAGNOSIS — Z01419 Encounter for gynecological examination (general) (routine) without abnormal findings: Secondary | ICD-10-CM

## 2016-05-03 DIAGNOSIS — Z0000 Encounter for general adult medical examination without abnormal findings: Secondary | ICD-10-CM

## 2016-05-03 DIAGNOSIS — E669 Obesity, unspecified: Secondary | ICD-10-CM

## 2016-05-03 LAB — POCT URINALYSIS DIPSTICK
Bilirubin, UA: NEGATIVE
Blood, UA: NEGATIVE
Glucose, UA: NEGATIVE
Ketones, UA: NEGATIVE
Leukocytes, UA: NEGATIVE
Nitrite, UA: NEGATIVE
Protein, UA: NEGATIVE
Urobilinogen, UA: NEGATIVE
pH, UA: 5

## 2016-05-03 MED ORDER — CLOBETASOL PROPIONATE 0.05 % EX OINT: 1.0000 "application " | TOPICAL_OINTMENT | Freq: Two times a day (BID) | CUTANEOUS | Status: DC

## 2016-05-03 NOTE — Progress Notes (Signed)
50 y.o. G2P2 MarriedCaucasianF here for annual exam.  Doing well but very frustrated with weight.  She has been going to weight watchers without a lot of success.  Would like to see a nutritionist.  She is walking regularly--walking 3 miles three to four times a week.  She walked 7 miles on Saturday with a friend.    House she had before she got remarried is now on the market.  This has been stressful.  She had a long term renter in it.    Stopped HRT this past year.  Glad she is off.  Still having some hot flashes.  Soy helps.   Denies vaginal bleeding.    No LMP recorded (within years).          Sexually active: Yes.    The current method of family planning is post menopausal status.    Exercising: Yes.    walking Smoker:  no  Health Maintenance: Pap:  01/02/2015 negative.  Neg HR HPV 11/13  History of abnormal Pap:  yes MMG:  10/12/2015 BIRADS 2 benign  Colonoscopy: 11/12.  Follow up 7 years.  Dr. Loreta Ave.  Aware. BMD:   never TDaP:  12/26/13 Pneumonia vaccine(s):  2011 Zostavax:   never Hep C testing: not indicated Screening Labs: PCP, Hb today: PCP, Urine today: normal    reports that she quit smoking about 21 years ago. She has never used smokeless tobacco. She reports that she drinks alcohol. She reports that she does not use illicit drugs.  Past Medical History  Diagnosis Date  . Lichen simplex chronicus   . CIN I (cervical intraepithelial neoplasia I) 1992    tx with cryo  . Migraine     with menses  . Patella fracture 11/06/14    broken patella-just released from PT    Past Surgical History  Procedure Laterality Date  . Breast surgery    . Gynecologic cryosurgery      Family History  Problem Relation Age of Onset  . Thyroid cancer Father 34  . Diabetes Father   . Hypertension Father   . Bladder Cancer Maternal Grandmother   . Heart attack Maternal Grandmother 72    deceased  . Hypertension Mother   . Pulmonary disease Maternal Grandmother   . Pulmonary  disease Paternal Grandfather   . Diverticulitis Paternal Grandmother   . Migraines Mother   . Migraines Brother   . Other Paternal Aunt     mass found during endoscopy-deceased 2 days after procedure while in the hospital    ROS:  Pertinent items are noted in HPI.  Otherwise, a comprehensive ROS was negative.  Exam:   BP 120/66 mmHg  Pulse 76  Resp 14  Ht  (1.676 m)  Wt 198 lb 6.4 oz (89.994 kg)  BMI 32.04 kg/m2  LMP  (Within Years)   Height:  (167.6 cm)  Ht Readings from Last 3 Encounters:  05/03/16  (1.676 m)  01/14/15  (1.676 m)  01/02/15  (1.676 m)    General appearance: alert, cooperative and appears stated age Head: Normocephalic, without obvious abnormality, atraumatic Neck: no adenopathy, supple, symmetrical, trachea midline and thyroid normal to inspection and palpation Lungs: clear to auscultation bilaterally Breasts: normal appearance, no masses or tenderness Heart: regular rate and rhythm Abdomen: soft, non-tender; bowel sounds normal; no masses,  no organomegaly Extremities: extremities normal, atraumatic, no cyanosis or edema Skin: Skin color, texture, turgor normal. No rashes or lesions Lymph nodes: Cervical,  supraclavicular, and axillary nodes normal. No abnormal inguinal nodes palpated Neurologic: Grossly normal   Pelvic: External genitalia:  no lesions              Urethra:  normal appearing urethra with no masses, tenderness or lesions              Bartholins and Skenes: normal                 Vagina: normal appearing vagina with normal color and discharge, no lesions              Cervix: no lesions              Pap taken: No. Bimanual Exam:  Uterus:  normal size, contour, position, consistency, mobility, non-tender              Adnexa: normal adnexa and no mass, fullness, tenderness               Rectovaginal: Confirms               Anus:  normal sphincter tone, no lesions  Physical Exam  Genitourinary:         Chaperone was present for exam.  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  No itching.    4mm thyroid nodule. Family hx of thyroid cancer in her father. SUI.  Saw Dr. Edward JollySilva for consultation.  Considering surgical correction H/O CIN 1992 Obesity  P: Mammogram yearly .  Neg pap 2016.  H/O neg HR HPV 11/13. Labs with Dr. Duanne Guessewey earlier this year Referral made to nutritionist for help with weight loss Clobetasol ointment 0.05% bid for 6 weeks.  Follow up for recheck.  May need repeat vulvar biopsy return annually or prn

## 2016-05-03 NOTE — Telephone Encounter (Signed)
Left message to call Kaitlyn at [phone]. 

## 2016-05-03 NOTE — Telephone Encounter (Signed)
Patient was seen today and forgot to ask Dr.Miller a question. Patient is asking if she is in menopause?

## 2016-05-05 NOTE — Telephone Encounter (Signed)
Call to patient, left message to call back. Ask for triage nurse. 

## 2016-05-13 NOTE — Telephone Encounter (Signed)
Yes, ok to close encounter. 

## 2016-05-13 NOTE — Telephone Encounter (Signed)
Dr.Miller, attempted to reach this patient x 2 with no return call. Okay to close encounter?

## 2016-06-02 ENCOUNTER — Encounter: Payer: Self-pay | Admitting: Obstetrics & Gynecology

## 2016-06-02 ENCOUNTER — Ambulatory Visit (INDEPENDENT_AMBULATORY_CARE_PROVIDER_SITE_OTHER): Payer: Managed Care, Other (non HMO) | Admitting: Obstetrics & Gynecology

## 2016-06-02 VITALS — BP 122/76 | HR 76 | Resp 14 | Ht 66.0 in | Wt 195.8 lb

## 2016-06-02 DIAGNOSIS — N950 Postmenopausal bleeding: Secondary | ICD-10-CM | POA: Diagnosis not present

## 2016-06-02 DIAGNOSIS — N9089 Other specified noninflammatory disorders of vulva and perineum: Secondary | ICD-10-CM | POA: Diagnosis not present

## 2016-06-02 LAB — FOLLICLE STIMULATING HORMONE: FSH: 41 m[IU]/mL

## 2016-06-02 NOTE — Progress Notes (Signed)
GYNECOLOGY  VISIT   HPI: 50 y.o. G2P2 Married Caucasian female with history of lichen simplex chronicus and LS & A that was biopsied several years ago.  Area was much more prominent on physical exam at [facility] 1 month ago.  She was started on topical steroid ointment.  Pt reports that she has been itching and the steroid has helped.  At [facility], she reported no symptoms.  She also feels like the area is smaller than it was.    Reports she did have a cycle that lasted five days on 05/27/16.  Recently stopped HRT that she was taking.  Expected never to have any bleeding.  Had some mild acne and PMS.  States it felt "just like a period".    GYNECOLOGIC HISTORY: Patient's last menstrual period was 05/27/2016. Contraception: vasectomy Menopausal hormone therapy: none  Patient Active Problem List   Diagnosis Date Noted  . Migraines 02/05/2013    Past Medical History  Diagnosis Date  . Lichen simplex chronicus   . CIN I (cervical intraepithelial neoplasia I) 1992    tx with cryo  . Migraine     with menses  . Patella fracture 11/06/14    broken patella-just released from PT    Past Surgical History  Procedure Laterality Date  . Breast surgery    . Gynecologic cryosurgery      MEDS:  Reviewed in EPIC and UTD  ALLERGIES: Review of patient's allergies indicates no known allergies.  Family History  Problem Relation Age of Onset  . Thyroid cancer Father 2062  . Diabetes Father   . Hypertension Father   . Bladder Cancer Maternal Grandmother   . Heart attack Maternal Grandmother 72    deceased  . Hypertension Mother   . Pulmonary disease Maternal Grandmother   . Pulmonary disease Paternal Grandfather   . Diverticulitis Paternal Grandmother   . Migraines Mother   . Migraines Brother   . Other Paternal Aunt     mass found during endoscopy-deceased 2 days after procedure while in the hospital    SH:  Married, non smoker  Review of Systems  All other systems reviewed and are  negative.   PHYSICAL EXAMINATION:    BP 122/76 mmHg  Pulse 76  Resp 14  Ht 5\' 6"  (1.676 m)  Wt 195 lb 12.8 oz (88.814 kg)  BMI 31.62 kg/m2  LMP 05/27/2016    Physical Exam  Constitutional: She is oriented to person, place, and time. She appears well-developed and well-nourished.  Cardiovascular: Normal rate and regular rhythm.   Respiratory: Effort normal and breath sounds normal.  GI: Soft. Bowel sounds are normal.  Genitourinary:     Neurological: She is alert and oriented to person, place, and time.  Skin: Skin is warm and dry.  Psychiatric: She has a normal mood and affect.   Consent obtained.  Area cleansed with Betadine x 3.  1.0cc 1% Lidocaine plain instilled.  Biopsy obtained with pickups and forceps.  Tissue sent to pathology after labeling.  Silver nitrate used for excellent hemostasis.  Pt tolerated procedure well.  Chaperone was present for exam.  Assessment: Thickened vulvar lesion PMP bleeding after stopping HRT a few months ago  Plan: Biopsy pending.  Will have pt decrease steroid ointment dosage to nightly and make further recommendations pending biopsy. Will check FSH. May need endometrial biopsy or PUS as well.

## 2016-06-09 ENCOUNTER — Telehealth: Payer: Self-pay | Admitting: Emergency Medicine

## 2016-06-09 NOTE — Telephone Encounter (Signed)
-----   Message from Jerene Bears, MD sent at 06/08/2016 12:39 PM EDT ----- Please let pt know that vulvar biopsy showed lichen simplex chronicus, same thing as last time I biopsied the area.  She should be using the steroid ointment at night only.  I'd like her to do this for two months and then recheck again.    FSH was 41, in menopausal range.  She had an episode of bleeding recently.  If she has bleeding again, she should call as I would do an ultrasound at that time.

## 2016-06-09 NOTE — Telephone Encounter (Signed)
Spoke with patient and message from Dr. Hyacinth Meeker discussed. She verbalized understanding. Appointment made for 08/11/16 for follow up with Dr. Hyacinth Meeker.  Discussed lab results and reasoning for need for Pelvic ultrasound if any additional bleeding.  She is agreeable and will call back for any new vaginal bleeding.  Routing to provider for final review. Patient agreeable to disposition. Will close encounter.

## 2016-08-11 ENCOUNTER — Encounter: Payer: Self-pay | Admitting: Obstetrics & Gynecology

## 2016-08-11 ENCOUNTER — Ambulatory Visit (INDEPENDENT_AMBULATORY_CARE_PROVIDER_SITE_OTHER): Payer: Managed Care, Other (non HMO) | Admitting: Obstetrics & Gynecology

## 2016-08-11 VITALS — BP 110/76 | HR 68 | Resp 14 | Ht 66.0 in | Wt 196.0 lb

## 2016-08-11 DIAGNOSIS — L280 Lichen simplex chronicus: Secondary | ICD-10-CM

## 2016-08-11 NOTE — Progress Notes (Signed)
GYNECOLOGY  VISIT   HPI: 50 y.o. G2P2 Married Caucasian female here for recheck of vulvar lesion.  Area on vulva has been biopsied twice with findings including lichen simplex chronicus and lichen sclerosus.  Pt has been using steroid, not faithfully, at night.  Reports itching/irritation symptoms have completely resolved.  Most recent biopsy was 06/02/16 with findings most consistent with lichen simplex chronicus.  Pt has some unrelated questions about her daughter and concerns she has about possible depression symptoms and feeling of inadequacy as well as control.  Pt and current spouse in counseling and she is finding this helpful and wants to discuss how to share some of this information with her daughter.  Just wants some feedback on thoughts.  GYNECOLOGIC HISTORY: Patient's last menstrual period was 05/27/2016. Contraception: Vasectomy  Menopausal hormone therapy: None  Patient Active Problem List   Diagnosis Date Noted  . Migraines 02/05/2013    Past Medical History:  Diagnosis Date  . CIN I (cervical intraepithelial neoplasia I) 1992   tx with cryo  . Lichen simplex chronicus   . Migraine    with menses  . Patella fracture 11/06/14   broken patella-just released from PT    Past Surgical History:  Procedure Laterality Date  . BREAST SURGERY    . GYNECOLOGIC CRYOSURGERY      MEDS:  Reviewed in EPIC and UTD  ALLERGIES: Review of patient's allergies indicates no known allergies.  Family History  Problem Relation Age of Onset  . Thyroid cancer Father 5962  . Diabetes Father   . Hypertension Father   . Bladder Cancer Maternal Grandmother   . Heart attack Maternal Grandmother 72    deceased  . Hypertension Mother   . Pulmonary disease Maternal Grandmother   . Pulmonary disease Paternal Grandfather   . Diverticulitis Paternal Grandmother   . Migraines Mother   . Migraines Brother   . Other Paternal Aunt     mass found during endoscopy-deceased 2 days after procedure  while in the hospital    SH:  Married, non smoker  Review of Systems  All other systems reviewed and are negative.   PHYSICAL EXAMINATION:    BP 110/76 (BP Location: Right Arm, Patient Position: Sitting, Cuff Size: Large)   Pulse 68   Resp 14   Ht 5\' 6"  (1.676 m)   Wt 196 lb (88.9 kg)   LMP 05/27/2016   BMI 31.64 kg/m     Physical Exam  Constitutional: She appears well-developed and well-nourished.  Genitourinary: Vagina normal.    There is lesion on the right labia. There is no rash, tenderness or injury on the right labia. There is no rash, tenderness, lesion or injury on the left labia.  Lymphadenopathy:       Right: No inguinal adenopathy present.       Left: No inguinal adenopathy present.  Skin: Skin is warm and dry.  Psychiatric: She has a normal mood and affect.   Chaperone was present for exam.  Assessment: Lichen simplex chronicus of vulva  Plan: Pt will taper off steroid.  Topical vaseline nightly encouraged.     ~15 minutes spent with patient, >50% of time was in face to face discussion specifically regarding concerns about her daughter.

## 2016-08-12 ENCOUNTER — Encounter: Payer: Self-pay | Admitting: Obstetrics & Gynecology

## 2016-08-12 DIAGNOSIS — L280 Lichen simplex chronicus: Secondary | ICD-10-CM | POA: Insufficient documentation

## 2017-03-07 DIAGNOSIS — E663 Overweight: Secondary | ICD-10-CM | POA: Insufficient documentation

## 2017-05-09 ENCOUNTER — Ambulatory Visit: Payer: Managed Care, Other (non HMO) | Admitting: Obstetrics & Gynecology

## 2017-06-27 ENCOUNTER — Ambulatory Visit: Payer: Managed Care, Other (non HMO) | Admitting: Obstetrics & Gynecology

## 2017-07-13 NOTE — Progress Notes (Signed)
51 y.o. G2P2 Married Caucasian F here for annual exam.  Doing well.  Daughter was in [place] this summer.  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  Passed the test.  Now she is figuring out what she wants to do next with this.  Step daughter finished nursing school and has moved back home.  Is working in the ER.  Denies vaginal bleeding.    Working on weight loss.  Hot flashes are better.  Feels has more issues with hot flashes when stressed.    Reports she took a friend's Adderall while she was studying for the real estate license.  Felt this made a huge difference with her attention but caused a lot more anxiety.  Does have an appt with Dr. Evelene Croon.    No LMP recorded (lmp unknown). Patient is not currently having periods (Reason: Other).          Sexually active: Yes.    The current method of family planning is vasectomy.    Exercising: Yes.    walking Smoker:  Former smoker    Health Maintenance: Pap:  01/02/15 negative, 09/27/12 negative, HR HPV negative  History of abnormal Pap:  yes MMG:  10/12/15 BIRADS 2 benign.  Pt aware this is due.   Colonoscopy:  11/12 with Dr. Loreta Ave- repeat 7 years  BMD:   never TDaP:  12/26/13  Pneumonia vaccine(s):  2011 Zostavax:   never Hep C testing: not indicated Screening Labs: discuss with provider, Hb today: same   reports that she quit smoking about 22 years ago. She has never used smokeless tobacco. She reports that she drinks alcohol. She reports that she does not use drugs.  Past Medical History:  Diagnosis Date  . CIN I (cervical intraepithelial neoplasia I) 1992   tx with cryo  . Lichen simplex chronicus   . Migraine    with menses  . Patella fracture 11/06/14   broken patella-just released from PT    Past Surgical History:  Procedure Laterality Date  . BREAST SURGERY    . GYNECOLOGIC CRYOSURGERY      Current Outpatient Prescriptions  Medication Sig Dispense Refill  . ARIPiprazole (ABILIFY) 2 MG tablet TK 1 T PO  QAM.  4  .  sertraline (ZOLOFT) 100 MG tablet Take 150 mg by mouth daily.     [name] topiramate (TOPAMAX) 25 MG tablet daily.     [name] VYVANSE 50 MG capsule daily.      No current facility-administered medications for this visit.     Family History  Problem Relation Age of Onset  . Thyroid cancer Father 17  . Diabetes Father   . Hypertension Father   . Hypertension Mother   . Migraines Mother   . Bladder Cancer Maternal Grandmother   . Heart attack Maternal Grandmother 72       deceased  . Pulmonary disease Maternal Grandmother   . Pulmonary disease Paternal Grandfather   . Diverticulitis Paternal Grandmother   . Migraines Brother   . Other Paternal Aunt        mass found during endoscopy-deceased 2 days after procedure while in the hospital    ROS:  Pertinent items are noted in HPI.  Otherwise, a comprehensive ROS was negative.  Exam:   BP 104/76 (BP Location: Right Arm, Patient Position: Sitting, Cuff Size: Normal)   Pulse 82   Resp 14   Ht 5\' 6"  (1.676 m)   Wt 179 lb (81.2 kg)   LMP  (LMP Unknown)  Comment: ~1 year ago   BMI 28.89 kg/m   Weight change: -20#  Height: 5\' 6"  (167.6 cm)  Ht Readings from Last 3 Encounters:  07/14/17 5\' 6"  (1.676 m)  08/11/16 5\' 6"  (1.676 m)  06/02/16 5\' 6"  (1.676 m)    General appearance: alert, cooperative and appears stated age Head: Normocephalic, without obvious abnormality, atraumatic Neck: no adenopathy, supple, symmetrical, trachea midline and thyroid normal to inspection and palpation Lungs: clear to auscultation bilaterally Breasts: normal appearance, no masses or tenderness Heart: regular rate and rhythm Abdomen: soft, non-tender; bowel sounds normal; no masses,  no organomegaly Extremities: extremities normal, atraumatic, no cyanosis or edema Skin: Skin color, texture, turgor normal. No rashes or lesions Lymph nodes: Cervical, supraclavicular, and axillary nodes normal. No abnormal inguinal nodes palpated Neurologic: Grossly  normal     Pelvic: External genitalia: raised, hypopigmented lesion on right labia minor (slightly more firm and slightly enlarged              Urethra:  normal appearing urethra with no masses, tenderness or lesions              Bartholins and Skenes: normal                 Vagina: normal appearing vagina with normal color and discharge, no lesions              Cervix: no lesions              Pap taken: Yes.   Bimanual Exam:  Uterus:  normal size, contour, position, consistency, mobility, non-tender              Adnexa: normal adnexa and no mass, fullness, tenderness               Rectovaginal: Confirms               Anus:  normal sphincter tone, no lesions  Chaperone was present for exam.  A:  Well Woman with normal exam PMP, no HRT, but with hot flashes H/O vulvar lichen simplex chronicus/lihcen sclerosus (biopsy proven).  Appearance is worse today than on prior exam H/O 4mm thyroid nodule.  Family hx of thyroid cancer in her father  P:   Mammogram guidelines reviewed.  Pt aware this is due. pap smear and HR HPV obtained today Lab work UTD D/w pt seeing both psychiatrist (has new pt appt with Dr. Evelene CroonKaur) and possibly seeing Dr. Elisabeth MostStevenson Trial of Gabapentin 100mg  nightly x 7 nights, then can increase weekly by 100mg  up to 300mg  to see if helps with hot flashes/night sweats.  #63/1RF.  Pt to give update at follow up Restart Clobetasol 0.05% ointment BID.  Rx to pharmacy.  Recheck 6 weeks. return annually or prn

## 2017-07-14 ENCOUNTER — Encounter: Payer: Self-pay | Admitting: Obstetrics & Gynecology

## 2017-07-14 ENCOUNTER — Other Ambulatory Visit (HOSPITAL_COMMUNITY)
Admission: RE | Admit: 2017-07-14 | Discharge: 2017-07-14 | Disposition: A | Discharge status: Home / Self Care | Payer: 59 | Source: Ambulatory Visit | Attending: Obstetrics & Gynecology | Admitting: Obstetrics & Gynecology

## 2017-07-14 ENCOUNTER — Ambulatory Visit (INDEPENDENT_AMBULATORY_CARE_PROVIDER_SITE_OTHER): Payer: 59 | Admitting: Obstetrics & Gynecology

## 2017-07-14 ENCOUNTER — Telehealth: Payer: Self-pay | Admitting: *Deleted

## 2017-07-14 VITALS — BP 104/76 | HR 82 | Resp 14 | Ht 66.0 in | Wt 179.0 lb

## 2017-07-14 DIAGNOSIS — Z01419 Encounter for gynecological examination (general) (routine) without abnormal findings: Secondary | ICD-10-CM | POA: Insufficient documentation

## 2017-07-14 DIAGNOSIS — F419 Anxiety disorder, unspecified: Secondary | ICD-10-CM | POA: Diagnosis not present

## 2017-07-14 DIAGNOSIS — Z124 Encounter for screening for malignant neoplasm of cervix: Secondary | ICD-10-CM | POA: Diagnosis not present

## 2017-07-14 MED ORDER — GABAPENTIN 100 MG PO CAPS: ORAL_CAPSULE | ORAL | 0 refills | Status: DC

## 2017-07-14 MED ORDER — CLOBETASOL PROPIONATE 0.05 % EX OINT: 1.0000 "application " | TOPICAL_OINTMENT | Freq: Two times a day (BID) | CUTANEOUS | 0 refills | Status: DC

## 2017-07-14 NOTE — Telephone Encounter (Signed)
Call to patient. Information provided to patient per Dr. Hyacinth MeekerMiller for Dr. Marisue BrooklynAmy Stevenson at [facility]. Phone number: [phone] and address: [address], [address] provided for patient. Patient verbalized understanding and appreciative of phone call and information.   Patient agreeable to disposition. Will close encounter.

## 2017-07-20 LAB — CYTOLOGY - PAP
Diagnosis: NEGATIVE
HPV: NOT DETECTED

## 2017-09-04 ENCOUNTER — Telehealth: Payer: Self-pay | Admitting: Obstetrics & Gynecology

## 2017-09-04 NOTE — Telephone Encounter (Signed)
Patient cancelled 6 week reck because she will be out of town Tuesday. Will call back to reschedule.

## 2017-09-05 ENCOUNTER — Ambulatory Visit: Payer: 59 | Admitting: Obstetrics & Gynecology

## 2018-09-14 ENCOUNTER — Ambulatory Visit: Payer: 59 | Admitting: Obstetrics & Gynecology

## 2018-10-10 ENCOUNTER — Ambulatory Visit: Payer: 59 | Admitting: Obstetrics & Gynecology

## 2018-11-13 ENCOUNTER — Ambulatory Visit: Payer: 59 | Admitting: Obstetrics & Gynecology

## 2018-11-30 ENCOUNTER — Other Ambulatory Visit: Payer: Self-pay | Admitting: Obstetrics & Gynecology

## 2018-11-30 DIAGNOSIS — Z1231 Encounter for screening mammogram for malignant neoplasm of breast: Secondary | ICD-10-CM

## 2018-12-03 ENCOUNTER — Encounter: Payer: Self-pay | Admitting: Radiology

## 2018-12-03 ENCOUNTER — Ambulatory Visit
Admission: RE | Admit: 2018-12-03 | Discharge: 2018-12-03 | Disposition: A | Discharge status: Home / Self Care | Payer: 59 | Source: Ambulatory Visit | Attending: Obstetrics & Gynecology | Admitting: Obstetrics & Gynecology

## 2018-12-03 DIAGNOSIS — Z1231 Encounter for screening mammogram for malignant neoplasm of breast: Secondary | ICD-10-CM

## 2018-12-25 ENCOUNTER — Other Ambulatory Visit: Payer: Self-pay

## 2018-12-25 ENCOUNTER — Encounter: Payer: Self-pay | Admitting: Obstetrics & Gynecology

## 2018-12-25 ENCOUNTER — Ambulatory Visit: Payer: 59 | Admitting: Obstetrics & Gynecology

## 2018-12-25 VITALS — BP 126/90 | HR 92 | Resp 16 | Ht 66.0 in | Wt 192.6 lb

## 2018-12-25 DIAGNOSIS — Z0000 Encounter for general adult medical examination without abnormal findings: Secondary | ICD-10-CM | POA: Diagnosis not present

## 2018-12-25 DIAGNOSIS — Z01419 Encounter for gynecological examination (general) (routine) without abnormal findings: Secondary | ICD-10-CM

## 2018-12-25 DIAGNOSIS — R9431 Abnormal electrocardiogram [ECG] [EKG]: Secondary | ICD-10-CM

## 2018-12-25 NOTE — Progress Notes (Signed)
53 y.o. G2P2 Married White or Caucasian female here for annual exam.  Doing well.  Denies vaginal bleeding.    Lost 26 pounds this past year.  Went to [facility] weight loss program.  Had EKG done 03/01/2017.  This showed possible septal infarct.  Denies chest pain or SOB.  MGM with hx of CVD.  PGF had MI.  Both parents have hypertension. Neither have had an MI.  Pt states she was never told about the EKG.  Was never told about this.  Would like to see cardiologist.    Has started pellet, estrogen/testosterone pellet and progesterone orally at night.  Feels better on the HRT.  She has been doing this about every 4 months and started in August.   No LMP recorded (lmp unknown). Patient is postmenopausal.          Sexually active: Yes.    The current method of family planning is post menopausal status.    Exercising: Yes.    gym Smoker:  no  Health Maintenance: Pap:  07/14/17 neg. HR HPV:neg  01/02/15 neg   History of abnormal Pap:  yes MMG:  12/03/18 BIRADS1:neg  Colonoscopy: 2012. Has appt 3/20.  Having endoscopy planned as well.   BMD: Never TDaP:  2015 Pneumonia vaccine(s):  n/a Shingrix:   No Hep C testing: n/a Screening Labs: had some done with GI.    reports that she quit smoking about 24 years ago. She has never used smokeless tobacco. She reports current alcohol use. She reports that she does not use drugs.  Past Medical History:  Diagnosis Date  . CIN I (cervical intraepithelial neoplasia I) 1992   tx with cryo  . Lichen simplex chronicus   . Migraine    with menses  . Patella fracture 11/06/14   broken patella-just released from PT    Past Surgical History:  Procedure Laterality Date  . BREAST SURGERY    . GYNECOLOGIC CRYOSURGERY      Current Outpatient Medications  Medication Sig Dispense Refill  . ARIPiprazole (ABILIFY) 2 MG tablet TK 1 T PO  QAM.  4  . atomoxetine (STRATTERA) 40 MG capsule TK ONE C PO  QD    . NON FORMULARY Biote - Hormone replacement palette     . progesterone (PROMETRIUM) 200 MG capsule TK ONE C PO QHS    . sertraline (ZOLOFT) 100 MG tablet Take 150 mg by mouth daily.     [name] VYVANSE 70 MG capsule Take 1 capsule by mouth daily.     No current facility-administered medications for this visit.     Family History  Problem Relation Age of Onset  . Thyroid cancer Father 30  . Diabetes Father   . Hypertension Father   . Hypertension Mother   . Migraines Mother   . Bladder Cancer Maternal Grandmother   . Heart attack Maternal Grandmother 72       deceased  . Pulmonary disease Maternal Grandmother   . Pulmonary disease Paternal Grandfather   . Diverticulitis Paternal Grandmother   . Migraines Brother   . Other Paternal Aunt        mass found during endoscopy-deceased 2 days after procedure while in the hospital    Review of Systems  Genitourinary:       Loss of urine with sneeze or cough   All other systems reviewed and are negative.   Exam:   BP 126/90 (BP Location: Left Arm, Patient Position: Sitting, Cuff Size: Large)  Pulse 92   Resp 16   Ht 5\' 6"  (1.676 m)   Wt 192 lb 9.6 oz (87.4 kg)   LMP  (LMP Unknown) Comment: ~1 year ago   BMI 31.09 kg/m   Height: 5\' 6"  (167.6 cm)  Ht Readings from Last 3 Encounters:  12/25/18 5\' 6"  (1.676 m)  07/14/17 5\' 6"  (1.676 m)  08/11/16 5\' 6"  (1.676 m)    General appearance: alert, cooperative and appears stated age Head: Normocephalic, without obvious abnormality, atraumatic Neck: no adenopathy, supple, symmetrical, trachea midline and thyroid normal to inspection and palpation Lungs: clear to auscultation bilaterally Breasts: normal appearance, no masses or tenderness Heart: regular rate and rhythm Abdomen: soft, non-tender; bowel sounds normal; no masses,  no organomegaly Extremities: extremities normal, atraumatic, no cyanosis or edema Skin: Skin color, texture, turgor normal. No rashes or lesions Lymph nodes: Cervical, supraclavicular, and axillary nodes normal. No  abnormal inguinal nodes palpated Neurologic: Grossly normal   Pelvic: External genitalia:  Thickened whitish tissue at the labia minora in the midline.  Picture present in 2018 note.  There is decreased white tissue changes from this exam              Urethra:  normal appearing urethra with no masses, tenderness or lesions              Bartholins and Skenes: normal                 Vagina: normal appearing vagina with normal color and discharge, no lesions              Cervix: no lesions              Pap taken: No. Bimanual Exam:  Uterus:  normal size, contour, position, consistency, mobility, non-tender              Adnexa: normal adnexa and no mass, fullness, tenderness               Rectovaginal: Confirms               Anus:  normal sphincter tone, no lesions  Chaperone was present for exam.  A:  Well Woman with normal exam PMP, receiving pellets/HRT from [facility] H/O vulvar changes showing simplex chronicus (biopsied x 2).  Improved today on exam H/O 61mm thyroid nodule.  Family hx of thyroid cancer in father. Abnormal EKG showing possible septal infarct  P:   Mammogram guidelines reviewed.  Encouraged yearly MMGs due to being on HRT. pap smear with neg HR HPV 2018.  Not indicated today. Referral to cardiology Lipids and TSH today Has appt for colonoscopy scheduled Shingrix vaccination discussed with pt.  She is considering this. Return annually or prn

## 2018-12-25 NOTE — Patient Instructions (Signed)
Outpatient Pharmacy at [facility] [address] [place],  [place]  [address]  Main: [phone] 

## 2018-12-26 LAB — LIPID PANEL
CHOL/HDL RATIO: 2.6 ratio (ref 0.0–4.4)
Cholesterol, Total: 164 mg/dL (ref 100–199)
HDL: 64 mg/dL (ref >39)
LDL Calculated: 79 mg/dL (ref 0–99)
TRIGLYCERIDES: 104 mg/dL (ref 0–149)
VLDL Cholesterol Cal: 21 mg/dL (ref 5–40)

## 2018-12-26 LAB — VITAMIN D 25 HYDROXY (VIT D DEFICIENCY, FRACTURES): Vit D, 25-Hydroxy: 40.6 ng/mL (ref 30.0–100.0)

## 2018-12-28 MED FILL — SHINGRIX 50 MCG SUS: 50 | 1 days supply | Qty: 1 | Fill #0

## 2019-01-15 ENCOUNTER — Telehealth: Payer: Self-pay | Admitting: Obstetrics & Gynecology

## 2019-01-15 NOTE — Telephone Encounter (Signed)
-----   Message ----- From: Alinda Deem Sent: 01/03/2019  11:16 AM EST To: Jerene Bears, MD Subject: Cardiology- Referral                           Dr. Hyacinth Meeker,   This is to notify you that your patient, Jordan Graham , is scheduled with  Chilton Si at [facility]  on 03/26/19. Is this time frame ok?  Thank you,  Soundra Pilon

## 2019-01-15 NOTE — Telephone Encounter (Signed)
Yes.  That is fine.  Thank you.  Ok to close encounter.

## 2019-01-23 ENCOUNTER — Encounter: Payer: Self-pay | Admitting: Obstetrics & Gynecology

## 2019-03-26 ENCOUNTER — Ambulatory Visit: Payer: 59 | Admitting: Cardiovascular Disease

## 2019-04-11 ENCOUNTER — Ambulatory Visit: Payer: 59 | Admitting: Cardiovascular Disease

## 2019-07-29 ENCOUNTER — Ambulatory Visit: Payer: 59 | Admitting: Cardiovascular Disease

## 2019-09-23 ENCOUNTER — Other Ambulatory Visit: Payer: Self-pay

## 2019-09-23 ENCOUNTER — Encounter: Payer: Self-pay | Admitting: Cardiovascular Disease

## 2019-09-23 ENCOUNTER — Ambulatory Visit: Payer: 59 | Admitting: Cardiovascular Disease

## 2019-09-23 VITALS — BP 132/80 | HR 86 | Temp 98.2°F | Ht 67.0 in | Wt 182.0 lb

## 2019-09-23 DIAGNOSIS — R9431 Abnormal electrocardiogram [ECG] [EKG]: Secondary | ICD-10-CM

## 2019-09-23 DIAGNOSIS — E663 Overweight: Secondary | ICD-10-CM | POA: Diagnosis not present

## 2019-09-23 DIAGNOSIS — Z8249 Family history of ischemic heart disease and other diseases of the circulatory system: Secondary | ICD-10-CM | POA: Diagnosis not present

## 2019-09-23 NOTE — Progress Notes (Signed)
Cardiology Office Note   Date:  09/23/2019   ID:  Jordan BlackbirdVanessa P Nouri, Park MeoDOB 1966-06-03, MRN [id number]  PCP:  Lewis Moccasinewey, Elizabeth R, MD  Cardiologist:   Chilton Siiffany [place], MD   Chief Complaint  Patient presents with  . New Patient (Initial Visit)  . Headache      History of Present Illness: Jordan Graham is a 53 y.o. female who is being seen today for the evaluation of an abnormal EKG at the request of Jerene BearsMiller, Mary S, MD.  She saw Dr. Hyacinth MeekerMiller 12/2018 and was noted to have an EKG from 02/2017 that sowe a possible septal infarct.  Given her family history of CAD she was referred to cardiology for further evaluation.  [facility] and had an EKG that was concerning for a possible septal infarct.  She denies any preceding history of exertional chest pain or shortness of breath she exercises regularly by walking.  She has not been as active lately, but 1 month ago she was alternating walking and running and had no exertional symptoms.  She does sometimes feel as though she has a difficulty getting a deep breath.  She denies lower extremity edema, orthopnea, or PND.  She has tingling and numbness that radiates into both legs.  She also reports chronic lower back pain.  She does not check her blood pressure regularly at home.  Both she and her husband have a very healthy lifestyle.  In addition to regular exercise she is also very careful about what she eats.  Lately she has been trying to increase her protein and limit carbohydrate intake.  She has been trying to lose weight but continues to struggle with this.  Her husband has a plant-based diet.  Encouraged her to do the same regimen has declined so far.   Past Medical History:  Diagnosis Date  . CIN I (cervical intraepithelial neoplasia I) 1992   tx with cryo  . Lichen simplex chronicus   . Migraine    with menses  . Patella fracture 11/06/14   broken patella-just released from PT    Past Surgical History:   Procedure Laterality Date  . BREAST SURGERY    . GYNECOLOGIC CRYOSURGERY       Current Outpatient Medications  Medication Sig Dispense Refill  . ARIPiprazole (ABILIFY) 2 MG tablet TK 1 T PO  QAM.  4  . atomoxetine (STRATTERA) 40 MG capsule TK ONE C PO  QD    . NON FORMULARY Biote - Hormone replacement palette    . progesterone (PROMETRIUM) 200 MG capsule TK ONE C PO QHS    . sertraline (ZOLOFT) 100 MG tablet Take 150 mg by mouth daily.     [name]. VYVANSE 70 MG capsule Take 1 capsule by mouth daily.     No current facility-administered medications for this visit.     Allergies:   Patient has no known allergies.    Social History:  The patient  reports that she quit smoking about 24 years ago. She has never used smokeless tobacco. She reports current alcohol use. She reports that she does not use drugs.   Family History:  The patient's family history includes Atrial fibrillation in her father; Bladder Cancer in her maternal grandmother; CAD in her mother; Diabetes in her father; Diverticulitis in her paternal grandmother; Heart attack (age of onset: 5372) in her maternal grandmother; Hypertension in her brother, brother, father, and mother; Migraines in her brother and  mother; Other in her paternal aunt; Pulmonary disease in her maternal grandmother and paternal grandfather; Thyroid cancer (age of onset: 70) in her father.    ROS:  Please see the history of present illness.   Otherwise, review of systems are positive for none.   All other systems are reviewed and negative.    PHYSICAL EXAM: VS:  BP 132/80   Pulse 86   Temp 98.2 F (36.8 C)   Ht 5\' 7"  (1.702 m)   Wt 182 lb (82.6 kg)   LMP  (LMP Unknown) Comment: ~1 year ago   BMI 28.51 kg/m  , BMI Body mass index is 28.51 kg/m. GENERAL:  Well appearing HEENT:  Pupils equal round and reactive, fundi not visualized, oral mucosa unremarkable NECK:  No jugular venous distention, waveform within normal limits, carotid upstroke brisk and  symmetric, no bruits LUNGS:  Clear to auscultation bilaterally HEART:  RRR.  PMI not displaced or sustained,S1 and S2 within normal limits, no S3, no S4, no clicks, no rubs, no murmurs ABD:  Flat, positive bowel sounds normal in frequency in pitch, no bruits, no rebound, no guarding, no midline pulsatile mass, no hepatomegaly, no splenomegaly EXT:  2 plus pulses throughout, no edema, no cyanosis no clubbing SKIN:  No rashes no nodules NEURO:  Cranial nerves II through XII grossly intact, motor grossly intact throughout PSYCH:  Cognitively intact, oriented to person place and time  EKG:  EKG is ordered today. The ekg ordered today demonstrates sinus rhythm. Rate 86 pbm.   Recent Labs: No results found for requested labs within last 8760 hours.    Lipid Panel    Component Value Date/Time   CHOL 164 12/25/2018 1426   TRIG 104 12/25/2018 1426   HDL 64 12/25/2018 1426   CHOLHDL 2.6 12/25/2018 1426   CHOLHDL 2.5 01/02/2015 1537   VLDL 15 01/02/2015 1537   LDLCALC 79 12/25/2018 1426      Wt Readings from Last 3 Encounters:  09/23/19 182 lb (82.6 kg)  12/25/18 192 lb 9.6 oz (87.4 kg)  07/14/17 179 lb (81.2 kg)      ASSESSMENT AND PLAN:  # Abnormal EKG:  On review of her EKG it is entirely normal.  Leads V1 and V2 do have Q waves.  However there is a very tiny R wave proceeding, which indicates that this is a normal finding.  She has no symptoms concerning for ischemia.  # Family history of CAD:  Ms. Korber has a strong family history of CAD.  She does not have any symptoms of ischemia.  She is interested in getting a coronary calcium score to better understand her risk.  LDL was only 79 on 12/2018.  ASCVD 10-year risk is otherwise too low to start aspirin or a statin.  She is very against medications and would like to control her lipids with diet and exercise.  Her husband has been successful with a plant-based diet and she would switch to this as well if necessary.  # Overweight:   BMI 28.  Continue working on diet and exercise    Current medicines are reviewed at length with the patient today.  The patient does not have concerns regarding medicines.  The following changes have been made:  no change  Labs/ tests ordered today include:   Orders Placed This Encounter  Procedures  . CT CARDIAC SCORING  . EKG 12-Lead     Disposition:   FU with Vitaly Wanat C. 01/2019, MD, [facility] in 2 years.  Signed, Avon Molock C. Oval Linsey, MD, [facility]  09/23/2019 1:35 PM    [facility]

## 2019-09-23 NOTE — Patient Instructions (Addendum)
Medication Instructions:  Your physician recommends that you continue on your current medications as directed. Please refer to the Current Medication list given to you today.  *If you need a refill on your cardiac medications before your next appointment, please call your pharmacy*  Lab Work: NONE   Testing/Procedures: CALCIUM SCORE TO BE DONE AT [facility] [address] THIS WILL COST $150 OUT OF POCKET  Follow-Up: At [facility], you and your health needs are our priority.  As part of our continuing mission to provide you with exceptional heart care, we have created designated Provider Care Teams.  These Care Teams include your primary Cardiologist (physician) and Advanced Practice Providers (APPs -  Physician Assistants and Nurse Practitioners) who all work together to provide you with the care you need, when you need it.  Your next appointment:   24 months   The format for your next appointment:   In Person  Provider:   You may see DR [facility]  or one of the following Advanced Practice Providers on your designated Care Team:    Kerin Ransom, PA-C  [place], [place]  Coletta Memos, [place]

## 2019-10-07 ENCOUNTER — Other Ambulatory Visit: Payer: Self-pay

## 2019-10-07 ENCOUNTER — Ambulatory Visit (INDEPENDENT_AMBULATORY_CARE_PROVIDER_SITE_OTHER)
Admission: RE | Admit: 2019-10-07 | Discharge: 2019-10-07 | Disposition: A | Discharge status: Home / Self Care | Payer: Self-pay | Source: Ambulatory Visit | Attending: Cardiovascular Disease | Admitting: Cardiovascular Disease

## 2019-10-07 DIAGNOSIS — R9431 Abnormal electrocardiogram [ECG] [EKG]: Secondary | ICD-10-CM

## 2019-12-10 ENCOUNTER — Other Ambulatory Visit: Payer: Self-pay | Admitting: Obstetrics & Gynecology

## 2019-12-10 DIAGNOSIS — Z1231 Encounter for screening mammogram for malignant neoplasm of breast: Secondary | ICD-10-CM

## 2019-12-30 ENCOUNTER — Ambulatory Visit: Payer: 59

## 2020-01-27 ENCOUNTER — Other Ambulatory Visit: Payer: Self-pay

## 2020-01-27 ENCOUNTER — Ambulatory Visit
Admission: RE | Admit: 2020-01-27 | Discharge: 2020-01-27 | Disposition: A | Discharge status: Home / Self Care | Payer: 59 | Source: Ambulatory Visit | Attending: Obstetrics & Gynecology | Admitting: Obstetrics & Gynecology

## 2020-01-27 DIAGNOSIS — Z1231 Encounter for screening mammogram for malignant neoplasm of breast: Secondary | ICD-10-CM

## 2020-04-28 NOTE — Progress Notes (Addendum)
54 y.o. G2P2 Married White or Caucasian female here for annual exam.  Doing well.  Denies vaginal bleeding.  Doing bio identical HRT pellet injection.  She is exercising really regular since end of January.  Averaged 2.1 miles for 2021.  She is on a little thyroid medication with the HRT as well.  She has follow up in July with Dr. Evelene Croon.  She is trying to come off her Abilify.  Daughter, Eliane Decree, moved to [place].  She is doing really well.  Pt is very pleased with how well her daughter has done.     No LMP recorded (lmp unknown). Patient is postmenopausal.          Sexually active: Yes.    The current method of family planning is post menopausal  Status & husband vasectomy.    Exercising: Yes.    walking Smoker:  no   Health Maintenance: Pap: 01-02-15 neg,  07-14-17 neg HPV HR neg History of abnormal Pap:  yes MMG:  01-28-2020 category c density birads 1:neg Colonoscopy:  2020 f/u 5 yrs BMD:   none TDaP:  2015 Pneumonia vaccine(s):  no Shingrix:   2020 had 1st one.  Pt aware she should just finish the series Hep C testing: not done Screening Labs: blood work will be done today   reports that she quit smoking about 25 years ago. She has never used smokeless tobacco. She reports current alcohol use. She reports that she does not use drugs.  Past Medical History:  Diagnosis Date  . CIN I (cervical intraepithelial neoplasia I) 1992   tx with cryo  . Lichen simplex chronicus   . Migraine    with menses  . Patella fracture 11/06/14   broken patella-just released from PT    Past Surgical History:  Procedure Laterality Date  . BREAST SURGERY    . GYNECOLOGIC CRYOSURGERY      Current Outpatient Medications  Medication Sig Dispense Refill  . ARIPiprazole (ABILIFY) 2 MG tablet TK 1 T PO  QAM.  4  . atomoxetine (STRATTERA) 40 MG capsule TK ONE C PO  QD    . NON FORMULARY Biote - Hormone replacement palette    . NP THYROID 30 MG tablet Take 30 mg by mouth daily.    . progesterone  (PROMETRIUM) 200 MG capsule TK ONE C PO QHS    . progesterone (PROMETRIUM) 200 MG capsule Take 200 mg by mouth at bedtime.    . sertraline (ZOLOFT) 100 MG tablet Take 150 mg by mouth daily.     [name] VYVANSE 70 MG capsule Take 1 capsule by mouth daily.     No current facility-administered medications for this visit.    Family History  Problem Relation Age of Onset  . Thyroid cancer Father 37  . Diabetes Father   . Hypertension Father   . Atrial fibrillation Father   . Hypertension Mother   . Migraines Mother   . CAD Mother   . Bladder Cancer Maternal Grandmother   . Heart attack Maternal Grandmother 72       deceased  . Pulmonary disease Maternal Grandmother   . Pulmonary disease Paternal Grandfather   . Diverticulitis Paternal Grandmother   . Migraines Brother   . Hypertension Brother   . Other Paternal Aunt        mass found during endoscopy-deceased 2 days after procedure while in the hospital  . Hypertension Brother     Review of Systems  Constitutional: Negative.   HENT:  Negative.   Eyes: Negative.   Respiratory: Negative.   Cardiovascular: Negative.   Gastrointestinal: Negative.   Endocrine: Negative.   Genitourinary: Negative.   Musculoskeletal: Negative.   Skin: Negative.   Allergic/Immunologic: Negative.   Neurological: Negative.   Hematological: Negative.   Psychiatric/Behavioral: Negative.     Exam:   BP 120/80   Pulse 74   Temp (!) 97.5 F (36.4 C) (Skin)   Resp 16   Ht 5\' 6"  (1.676 m)   Wt 185 lb (83.9 kg)   LMP  (LMP Unknown) Comment: ~1 year ago   BMI 29.86 kg/m   Height: 5\' 6"  (167.6 cm)  General appearance: alert, cooperative and appears stated age Head: Normocephalic, without obvious abnormality, atraumatic Neck: no adenopathy, supple, symmetrical, trachea midline and thyroid normal to inspection and palpation Lungs: clear to auscultation bilaterally Breasts: normal appearance, no masses or tenderness Heart: regular rate and  rhythm Abdomen: soft, non-tender; bowel sounds normal; no masses,  no organomegaly Extremities: extremities normal, atraumatic, no cyanosis or edema Skin: Skin color, texture, turgor normal. No rashes or lesions Lymph nodes: Cervical, supraclavicular, and axillary nodes normal. No abnormal inguinal nodes palpated Neurologic: Grossly normal   Pelvic: External genitalia:  Small amount of thickened hypopigmented tissue beneath the clitoris (that has been biopsied x 2)              Urethra:  normal appearing urethra with no masses, tenderness or lesions              Bartholins and Skenes: normal                 Vagina: normal appearing vagina with normal color and discharge, no lesions              Cervix: no lesions              Pap taken: Yes.   Bimanual Exam:  Uterus:  normal size, contour, position, consistency, mobility, non-tender              Adnexa: normal adnexa and no mass, fullness, tenderness               Rectovaginal: Confirms               Anus:  normal sphincter tone, no lesions  Chaperone, Terence Lux, CMA, was present for exam.  A:  Well Woman with normal exam PMP, using HRT pellets form [facility] H/o vulvar lichen simplex chronicus (biopsied x 2) H/o 57mm thyroid nodule.  Family hx of thyroid cancer in her father 66mm lung nodule noted on coronary CT (saw cardiology last year)  P:   Mammogram guidelines reviewed pap smear with HR HPV obtained today Colonoscopy and EGD UTD Lab work obtained today:  CBC, CMP, lipids, TSH and Vit D She is going to finish her Shingrix vaccination.  Information given. Return annually or prn

## 2020-05-01 ENCOUNTER — Ambulatory Visit: Payer: 59 | Admitting: Obstetrics & Gynecology

## 2020-05-01 ENCOUNTER — Encounter: Payer: Self-pay | Admitting: Obstetrics & Gynecology

## 2020-05-01 ENCOUNTER — Other Ambulatory Visit (HOSPITAL_COMMUNITY)
Admission: RE | Admit: 2020-05-01 | Discharge: 2020-05-01 | Disposition: A | Discharge status: Home / Self Care | Payer: 59 | Source: Ambulatory Visit | Attending: Obstetrics & Gynecology | Admitting: Obstetrics & Gynecology

## 2020-05-01 ENCOUNTER — Other Ambulatory Visit: Payer: Self-pay

## 2020-05-01 VITALS — BP 120/80 | HR 74 | Temp 97.5°F | Resp 16 | Ht 66.0 in | Wt 185.0 lb

## 2020-05-01 DIAGNOSIS — Z124 Encounter for screening for malignant neoplasm of cervix: Secondary | ICD-10-CM | POA: Insufficient documentation

## 2020-05-01 DIAGNOSIS — Z0000 Encounter for general adult medical examination without abnormal findings: Secondary | ICD-10-CM | POA: Diagnosis not present

## 2020-05-01 DIAGNOSIS — Z01419 Encounter for gynecological examination (general) (routine) without abnormal findings: Secondary | ICD-10-CM | POA: Diagnosis not present

## 2020-05-01 NOTE — Patient Instructions (Addendum)
Crist Fat, MD [facility] [address] [place], [place] [address]  [phone]   Outpatient Pharmacy at [facility] [address] [place],  [place]  [address]  Main: [phone]

## 2020-05-01 NOTE — Addendum Note (Signed)
Addended by: Jerene Bears on: 05/01/2020 12:27 PM   Modules accepted: Orders

## 2020-05-02 LAB — CBC
Hematocrit: 38.9 % (ref 34.0–46.6)
Hemoglobin: 13.5 g/dL (ref 11.1–15.9)
MCH: 31.1 pg (ref 26.6–33.0)
MCHC: 34.7 g/dL (ref 31.5–35.7)
MCV: 90 fL (ref 79–97)
Platelets: 205 10*3/uL (ref 150–450)
RBC: 4.34 x10E6/uL (ref 3.77–5.28)
RDW: 13 % (ref 11.7–15.4)
WBC: 5.4 10*3/uL (ref 3.4–10.8)

## 2020-05-02 LAB — COMPREHENSIVE METABOLIC PANEL
ALT: 14 IU/L (ref 0–32)
AST: 15 IU/L (ref 0–40)
Albumin/Globulin Ratio: 1.9 (ref 1.2–2.2)
Albumin: 4.3 g/dL (ref 3.8–4.9)
Alkaline Phosphatase: 60 IU/L (ref 48–121)
BUN/Creatinine Ratio: 16 (ref 9–23)
BUN: 11 mg/dL (ref 6–24)
Bilirubin Total: 0.3 mg/dL (ref 0.0–1.2)
CO2: 22 mmol/L (ref 20–29)
Calcium: 9.3 mg/dL (ref 8.7–10.2)
Chloride: 105 mmol/L (ref 96–106)
Creatinine, Ser: 0.68 mg/dL (ref 0.57–1.00)
GFR calc Af Amer: 115 mL/min/{1.73_m2} (ref >59)
GFR calc non Af Amer: 99 mL/min/{1.73_m2} (ref >59)
Globulin, Total: 2.3 g/dL (ref 1.5–4.5)
Glucose: 81 mg/dL (ref 65–99)
Potassium: 4.3 mmol/L (ref 3.5–5.2)
Sodium: 139 mmol/L (ref 134–144)
Total Protein: 6.6 g/dL (ref 6.0–8.5)

## 2020-05-02 LAB — TSH: TSH: 0.791 u[IU]/mL (ref 0.450–4.500)

## 2020-05-02 LAB — LIPID PANEL
Chol/HDL Ratio: 2.3 ratio (ref 0.0–4.4)
Cholesterol, Total: 167 mg/dL (ref 100–199)
HDL: 72 mg/dL (ref >39)
LDL Chol Calc (NIH): 84 mg/dL (ref 0–99)
Triglycerides: 55 mg/dL (ref 0–149)
VLDL Cholesterol Cal: 11 mg/dL (ref 5–40)

## 2020-05-02 LAB — VITAMIN D 25 HYDROXY (VIT D DEFICIENCY, FRACTURES): Vit D, 25-Hydroxy: 31.8 ng/mL (ref 30.0–100.0)

## 2020-05-05 LAB — CYTOLOGY - PAP
Comment: NEGATIVE
Diagnosis: NEGATIVE
High risk HPV: NEGATIVE

## 2020-05-08 ENCOUNTER — Encounter: Payer: Self-pay | Admitting: Obstetrics & Gynecology

## 2020-05-08 MED FILL — SHINGRIX 50 MCG SUS: 50 | 1 days supply | Qty: 1 | Fill #0

## 2021-01-07 ENCOUNTER — Other Ambulatory Visit: Payer: Self-pay | Admitting: Obstetrics & Gynecology

## 2021-01-07 DIAGNOSIS — Z1231 Encounter for screening mammogram for malignant neoplasm of breast: Secondary | ICD-10-CM

## 2021-02-19 ENCOUNTER — Other Ambulatory Visit: Payer: Self-pay

## 2021-02-19 ENCOUNTER — Ambulatory Visit
Admission: RE | Admit: 2021-02-19 | Discharge: 2021-02-19 | Disposition: A | Discharge status: Home / Self Care | Payer: 59 | Source: Ambulatory Visit | Attending: Obstetrics & Gynecology | Admitting: Obstetrics & Gynecology

## 2021-02-19 DIAGNOSIS — Z1231 Encounter for screening mammogram for malignant neoplasm of breast: Secondary | ICD-10-CM

## 2021-05-10 ENCOUNTER — Ambulatory Visit (HOSPITAL_BASED_OUTPATIENT_CLINIC_OR_DEPARTMENT_OTHER): Payer: 59 | Admitting: Obstetrics & Gynecology

## 2021-06-24 ENCOUNTER — Ambulatory Visit: Payer: 59

## 2021-06-30 ENCOUNTER — Other Ambulatory Visit: Payer: Self-pay

## 2021-06-30 ENCOUNTER — Ambulatory Visit (INDEPENDENT_AMBULATORY_CARE_PROVIDER_SITE_OTHER): Payer: 59 | Admitting: Obstetrics & Gynecology

## 2021-06-30 ENCOUNTER — Encounter (HOSPITAL_BASED_OUTPATIENT_CLINIC_OR_DEPARTMENT_OTHER): Payer: Self-pay | Admitting: Obstetrics & Gynecology

## 2021-06-30 VITALS — BP 144/85 | HR 82 | Ht 66.0 in | Wt 190.4 lb

## 2021-06-30 DIAGNOSIS — Z01419 Encounter for gynecological examination (general) (routine) without abnormal findings: Secondary | ICD-10-CM

## 2021-06-30 DIAGNOSIS — R911 Solitary pulmonary nodule: Secondary | ICD-10-CM

## 2021-06-30 DIAGNOSIS — L280 Lichen simplex chronicus: Secondary | ICD-10-CM

## 2021-06-30 DIAGNOSIS — Z780 Asymptomatic menopausal state: Secondary | ICD-10-CM | POA: Diagnosis not present

## 2021-06-30 DIAGNOSIS — M25552 Pain in left hip: Secondary | ICD-10-CM

## 2021-06-30 DIAGNOSIS — Z1159 Encounter for screening for other viral diseases: Secondary | ICD-10-CM

## 2021-06-30 DIAGNOSIS — M25562 Pain in left knee: Secondary | ICD-10-CM

## 2021-06-30 DIAGNOSIS — G8929 Other chronic pain: Secondary | ICD-10-CM

## 2021-06-30 NOTE — Progress Notes (Signed)
55 y.o. G2P2 Married White or Caucasian female here for annual exam.  On HRT from [facility].  Denies vaginal bleeding.   Having a lot of hip pain.  Not sure what to do.  Really doesn't want surgery.  D/w pt referral to sports medicine for more conservative management if appropriate.  Has questions about lung nodule on coronary CT.  Really would like to see pulmonology.  Just keeps worrying this is something and would like reassurance from specialist.  No LMP recorded (lmp unknown). Patient is postmenopausal.          Sexually active: Yes.    The current method of family planning is post menopausal status.    Smoker:  no  Health Maintenance: Pap:  05/01/2020 Negative History of abnormal Pap:  remote hx MMG:  02/19/2021 Negative Colonoscopy:  01/23/2019, follow up 5 years BMD:   not indicated TDaP:  2015 Shingrix:   2020 Hep C testing: will obtain today Screening Labs: done last year   reports that she has quit smoking. Her smoking use included cigarettes. She has never used smokeless tobacco. She reports current alcohol use. She reports that she does not use drugs.  Past Medical History:  Diagnosis Date   CIN I (cervical intraepithelial neoplasia I) 1992   tx with cryo   Lichen simplex chronicus    Migraine    with menses   Patella fracture 11/06/14   broken patella-just released from PT    Past Surgical History:  Procedure Laterality Date   BREAST BIOPSY Left    ? about 25 years ago   BREAST SURGERY     GYNECOLOGIC CRYOSURGERY      Current Outpatient Medications  Medication Sig Dispense Refill   desvenlafaxine (PRISTIQ) 50 MG 24 hr tablet Take 50 mg by mouth daily.     NON FORMULARY Biote - Hormone replacement palette     progesterone (PROMETRIUM) 200 MG capsule Take 200 mg by mouth at bedtime.     sertraline (ZOLOFT) 100 MG tablet Take 150 mg by mouth daily.      VYVANSE 70 MG capsule Take 1 capsule by mouth daily.     No current facility-administered  medications for this visit.    Family History  Problem Relation Age of Onset   Thyroid cancer Father 71   Diabetes Father    Hypertension Father    Atrial fibrillation Father    Hypertension Mother    Migraines Mother    CAD Mother    Bladder Cancer Maternal Grandmother    Heart attack Maternal Grandmother 26       deceased   Pulmonary disease Maternal Grandmother    Pulmonary disease Paternal Grandfather    Diverticulitis Paternal Grandmother    Migraines Brother    Hypertension Brother    Other Paternal Aunt        mass found during endoscopy-deceased 2 days after procedure while in the hospital   Breast cancer Paternal Aunt    Hypertension Brother     Review of Systems  All other systems reviewed and are negative.  Exam:   BP (!) 144/85 (BP Location: Right Arm, Patient Position: Sitting, Cuff Size: Large)   Pulse 82   Ht 5\' 6"  (1.676 m)   Wt 190 lb 6.4 oz (86.4 kg)   LMP  (LMP Unknown) Comment: ~1 year ago   BMI 30.73 kg/m   Height: 5\' 6"  (167.6 cm)  General appearance: alert, cooperative and appears stated age Head: Normocephalic,  without obvious abnormality, atraumatic Neck: no adenopathy, supple, symmetrical, trachea midline and thyroid normal to inspection and palpation Lungs: clear to auscultation bilaterally Breasts: normal appearance, no masses or tenderness Heart: regular rate and rhythm Abdomen: soft, non-tender; bowel sounds normal; no masses,  no organomegaly Extremities: extremities normal, atraumatic, no cyanosis or edema Skin: Skin color, texture, turgor normal. No rashes or lesions Lymph nodes: Cervical, supraclavicular, and axillary nodes normal. No abnormal inguinal nodes palpated Neurologic: Grossly normal   Pelvic: External genitalia:  whitish lesion unchange on labia minora on pt's left              Urethra:  normal appearing urethra with no masses, tenderness or lesions              Bartholins and Skenes: normal                 Vagina:  normal appearing vagina with normal color and no discharge, no lesions              Cervix: no lesions              Pap taken: No. Bimanual Exam:  Uterus:  normal size, contour, position, consistency, mobility, non-tender              Adnexa: normal adnexa and no mass, fullness, tenderness               Rectovaginal: Confirms               Anus:  normal sphincter tone, no lesions  Chaperone, Ina Homes, CMA, was present for exam.  Assessment/Plan: 1. Well woman exam with routine gynecological exam - Pap neg with neg HR HPV 05/01/2020 - MMG 02/19/2021 - colonoscopy 01/23/2019 follow up 5 years - vaccines reviewed - screening lab work was done last year.  Does need Hep C testing.  Will plan with next lab draw.  2. Postmenopausal - on HRT managed by [facility]  3. Lichen simplex chronicus - area has been biopsied x 2.  Pt does not want to use steroid therapy as is not symptomatic  4. Lung nodule - Ambulatory referral to Pulmonology  5. Left hip pain - AMB referral to sports medicine  6. Chronic pain of left knee - AMB referral to sports medicine

## 2021-07-04 DIAGNOSIS — R911 Solitary pulmonary nodule: Secondary | ICD-10-CM | POA: Insufficient documentation

## 2021-07-04 DIAGNOSIS — Z780 Asymptomatic menopausal state: Secondary | ICD-10-CM | POA: Insufficient documentation

## 2021-07-04 DIAGNOSIS — Z1159 Encounter for screening for other viral diseases: Secondary | ICD-10-CM | POA: Insufficient documentation

## 2021-07-13 ENCOUNTER — Ambulatory Visit: Payer: Self-pay

## 2021-07-13 ENCOUNTER — Ambulatory Visit (INDEPENDENT_AMBULATORY_CARE_PROVIDER_SITE_OTHER): Payer: 59

## 2021-07-13 ENCOUNTER — Ambulatory Visit: Payer: 59 | Admitting: Family Medicine

## 2021-07-13 ENCOUNTER — Other Ambulatory Visit: Payer: Self-pay

## 2021-07-13 ENCOUNTER — Encounter: Payer: Self-pay | Admitting: Family Medicine

## 2021-07-13 VITALS — BP 130/80 | HR 100 | Ht 66.0 in | Wt 190.0 lb

## 2021-07-13 DIAGNOSIS — M216X2 Other acquired deformities of left foot: Secondary | ICD-10-CM

## 2021-07-13 DIAGNOSIS — M25552 Pain in left hip: Secondary | ICD-10-CM

## 2021-07-13 DIAGNOSIS — M545 Low back pain, unspecified: Secondary | ICD-10-CM | POA: Diagnosis not present

## 2021-07-13 DIAGNOSIS — G8929 Other chronic pain: Secondary | ICD-10-CM

## 2021-07-13 DIAGNOSIS — M25562 Pain in left knee: Secondary | ICD-10-CM

## 2021-07-13 MED ORDER — GABAPENTIN 100 MG PO CAPS: 200.0000 mg | ORAL_CAPSULE | Freq: Every day | ORAL | 0 refills | Status: DC

## 2021-07-13 NOTE — Assessment & Plan Note (Signed)
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  Discussed with patient about proper shoes, topical anti-inflammatories, icing regimen.  Increase activity slowly.

## 2021-07-13 NOTE — Patient Instructions (Addendum)
Xray today [facility] [employer] Supports Switch walking shoes to [facility] Exercises 3-5 times per week Gabapentin 200mg  at night Follow up 4-8 weeks

## 2021-07-13 NOTE — Assessment & Plan Note (Addendum)
Patient does have low back pain.  Discussed with patient at great length.  Discussed icing regimen and home exercises.  Discussed which activities to do and which ones to avoid.  Increase activity slowly.  Patient will increase activity slowly.  X-rays are pending.  Started on gabapentin.  Follow-up again in 4 to 8 weeks  Patient may be a candidate for manipulation at follow-up

## 2021-07-13 NOTE — Progress Notes (Signed)
[name] [address] [place] [address] Phone: [phone] Subjective:   Bruce Donath, am serving as a scribe for Dr. Antoine Primas. This visit occurred during the SARS-CoV-2 public health emergency.  Safety protocols were in place, including screening questions prior to the visit, additional usage of staff PPE, and extensive cleaning of exam room while observing appropriate contact time as indicated for disinfecting solutions.   I'm seeing this patient by the request  of:  Valentina Shaggy MD  CC: hip and knee pain both left side   [id number]  Jordan [facility] is a 55 y.o. female coming in with complaint of L knee and L hip pain. Patient states tender IT band. Walking/ sitting hurting hip. Pain in hip knee and foot. Inserts are not helping. Did have bulging dics  Onset- chronic pain 2 years plus Location - left hip, knee, and ankle Reliving factors- none Therapies tried- PT Reviewed patient's imaging previously in 2010.  Did have a disc bulging.  Did respond well to an epidural in 2010.    Past Medical History:  Diagnosis Date   CIN I (cervical intraepithelial neoplasia I) 1992   tx with cryo   Lichen simplex chronicus    Migraine    with menses   Patella fracture 11/06/14   broken patella-just released from PT   Past Surgical History:  Procedure Laterality Date   BREAST BIOPSY Left    ? about 25 years ago   BREAST SURGERY     GYNECOLOGIC CRYOSURGERY     Social History   Socioeconomic History   Marital status: Married    Spouse name: Not on file   Number of children: Not on file   Years of education: Not on file   Highest education level: Not on file  Occupational History   Not on file  Tobacco Use   Smoking status: Former    Types: Cigarettes   Smokeless tobacco: Never  Vaping Use   Vaping Use: Never used  Substance and Sexual Activity   Alcohol use: Yes    Alcohol/week: 0.0 - 1.0 standard drinks   Drug use: Never    Sexual activity: Yes    Partners: Male    Birth control/protection: Other-see comments, Post-menopausal    Comment: vasectomy  Other Topics Concern   Not on file  Social History Narrative   Not on file   Social Determinants of Health   Financial Resource Strain: Not on file  Food Insecurity: Not on file  Transportation Needs: Not on file  Physical Activity: Not on file  Stress: Not on file  Social Connections: Not on file   No Known Allergies Family History  Problem Relation Age of Onset   Thyroid cancer Father 74   Diabetes Father    Hypertension Father    Atrial fibrillation Father    Hypertension Mother    Migraines Mother    CAD Mother    Bladder Cancer Maternal Grandmother    Heart attack Maternal Grandmother 82       deceased   Pulmonary disease Maternal Grandmother    Pulmonary disease Paternal Grandfather    Diverticulitis Paternal Grandmother    Migraines Brother    Hypertension Brother    Other Paternal Aunt        mass found during endoscopy-deceased 2 days after procedure while in the hospital   Breast cancer Paternal Aunt    Hypertension Brother     Current Outpatient Medications (Endocrine &  Metabolic):    progesterone (PROMETRIUM) 200 MG capsule, Take 200 mg by mouth at bedtime.      Current Outpatient Medications (Other):    gabapentin (NEURONTIN) 100 MG capsule, Take 2 capsules (200 mg total) by mouth at bedtime.   desvenlafaxine (PRISTIQ) 50 MG 24 hr tablet, Take 50 mg by mouth daily.   NON FORMULARY, Biote - Hormone replacement palette   sertraline (ZOLOFT) 100 MG tablet, Take 150 mg by mouth daily.    VYVANSE 70 MG capsule, Take 1 capsule by mouth daily.   Reviewed prior external information including notes and imaging from  primary care provider As well as notes that were available from care everywhere and other healthcare systems.  Past medical history, social, surgical and family history all reviewed in electronic medical record.   No pertanent information unless stated regarding to the chief complaint.   Review of Systems:  No headache, visual changes, nausea, vomiting, diarrhea, constipation, dizziness, abdominal pain, skin rash, fevers, chills, night sweats, weight loss, swollen lymph nodes, body aches, joint swelling, chest pain, shortness of breath, mood changes. POSITIVE muscle aches  Objective  Blood pressure 130/80, pulse 100, height 5\' 6"  (1.676 m), weight 190 lb (86.2 kg), SpO2 97 %.   General: No apparent distress alert and oriented x3 mood and affect normal, dressed appropriately.  HEENT: Pupils equal, extraocular movements intact  Respiratory: Patient's speak in full sentences and does not appear short of breath  Cardiovascular: No lower extremity edema, non tender, no erythema  Gait normal with good balance and coordination.  MSK: Patient's left foot shows significant breakdown of the transverse arch noted.  Patient does have limited Holick's noted.  Patient does have some mild instability noted of the ankle.  Left knee exam mild lateral tracking noted of the patella.  Patient does have pain over the greater trochanteric area.  Mild straight leg test noted of the back.  Mild radicular symptoms.  Patient also has tenderness over the sacroiliac joint noted.  [id number]; 15 additional minutes spent for Therapeutic exercises as stated in above notes.  This included exercises focusing on stretching, strengthening, with significant focus on eccentric aspects.   Long term goals include an improvement in range of motion, strength, endurance as well as avoiding reinjury. Patient's frequency would include in 1-2 times a day, 3-5 times a week for a duration of 6-12 weeks. Low back exercises that included:  Pelvic tilt/bracing instruction to focus on control of the pelvic girdle and lower abdominal muscles  Glute strengthening exercises, focusing on proper firing of the glutes without engaging the low back muscles Proper  stretching techniques for maximum relief for the hamstrings, hip flexors, low back and some rotation where tolerated  Proper technique shown and discussed handout in great detail with ATC.  All questions were discussed and answered.      Impression and Recommendations:     The above documentation has been reviewed and is accurate and complete , DO

## 2021-07-28 ENCOUNTER — Encounter (HOSPITAL_BASED_OUTPATIENT_CLINIC_OR_DEPARTMENT_OTHER): Payer: Self-pay

## 2021-08-12 NOTE — Progress Notes (Deleted)
[facility] [address] [place] [address] Phone: [phone] Subjective:    I'm seeing this patient by the request  of:  Lewis Moccasin, MD  CC:   [id number]  07/13/2021 Patient does have low back pain.  Discussed with patient at great length.  Discussed icing regimen and home exercises.  Discussed which activities to do and which ones to avoid.  Increase activity slowly.  Patient will increase activity slowly.  X-rays are pending.  Started on gabapentin.  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  Discussed with patient about proper shoes, topical anti-inflammatories, icing regimen.  Increase activity slowly.  Update 08/17/2021 Jordan Graham is a 55 y.o. female coming in with complaint of L knee, L hip and LBP. Patient states   Onset-  Location Duration-  Character- Aggravating factors- Reliving factors-  Therapies tried-  Severity-     Past Medical History:  Diagnosis Date   CIN I (cervical intraepithelial neoplasia I) 1992   tx with cryo   Lichen simplex chronicus    Migraine    with menses   Patella fracture 11/06/14   broken patella-just released from PT   Past Surgical History:  Procedure Laterality Date   BREAST BIOPSY Left    ? about 25 years ago   BREAST SURGERY     GYNECOLOGIC CRYOSURGERY     Social History   Socioeconomic History   Marital status: Married    Spouse name: Not on file   Number of children: Not on file   Years of education: Not on file   Highest education level: Not on file  Occupational History   Not on file  Tobacco Use   Smoking status: Former    Types: Cigarettes   Smokeless tobacco: Never  Vaping Use   Vaping Use: Never used  Substance and Sexual Activity   Alcohol use: Yes    Alcohol/week: 0.0 - 1.0 standard drinks    Drug use: Never   Sexual activity: Yes    Partners: Male    Birth control/protection: Other-see comments, Post-menopausal    Comment: vasectomy  Other Topics Concern   Not on file  Social History Narrative   Not on file   Social Determinants of Health   Financial Resource Strain: Not on file  Food Insecurity: Not on file  Transportation Needs: Not on file  Physical Activity: Not on file  Stress: Not on file  Social Connections: Not on file   No Known Allergies Family History  Problem Relation Age of Onset   Thyroid cancer Father 79   Diabetes Father    Hypertension Father    Atrial fibrillation Father    Hypertension Mother    Migraines Mother    CAD Mother    Bladder Cancer Maternal Grandmother    Heart attack Maternal Grandmother 57       deceased   Pulmonary disease Maternal Grandmother    Pulmonary disease Paternal Grandfather    Diverticulitis Paternal Grandmother    Migraines Brother    Hypertension Brother    Other Paternal Aunt        mass found during endoscopy-deceased 2 days after procedure while in the hospital   Breast cancer Paternal Aunt    Hypertension Brother     Current Outpatient Medications (Endocrine &  Metabolic):    progesterone (PROMETRIUM) 200 MG capsule, Take 200 mg by mouth at bedtime.      Current Outpatient Medications (Other):    desvenlafaxine (PRISTIQ) 50 MG 24 hr tablet, Take 50 mg by mouth daily.   gabapentin (NEURONTIN) 100 MG capsule, Take 2 capsules (200 mg total) by mouth at bedtime.   NON FORMULARY, Biote - Hormone replacement palette   sertraline (ZOLOFT) 100 MG tablet, Take 150 mg by mouth daily.    VYVANSE 70 MG capsule, Take 1 capsule by mouth daily.   Reviewed prior external information including notes and imaging from  primary care provider As well as notes that were available from care everywhere and other healthcare systems.  Past medical history, social, surgical and family history all reviewed in electronic  medical record.  No pertanent information unless stated regarding to the chief complaint.   Review of Systems:  No headache, visual changes, nausea, vomiting, diarrhea, constipation, dizziness, abdominal pain, skin rash, fevers, chills, night sweats, weight loss, swollen lymph nodes, body aches, joint swelling, chest pain, shortness of breath, mood changes. POSITIVE muscle aches  Objective  There were no vitals taken for this visit.   General: No apparent distress alert and oriented x3 mood and affect normal, dressed appropriately.  HEENT: Pupils equal, extraocular movements intact  Respiratory: Patient's speak in full sentences and does not appear short of breath  Cardiovascular: No lower extremity edema, non tender, no erythema  Gait normal with good balance and coordination.  MSK:  Non tender with full range of motion and good stability and symmetric strength and tone of shoulders, elbows, wrist, hip, knee and ankles bilaterally.     Impression and Recommendations:     The above documentation has been reviewed and is accurate and complete [name]

## 2021-08-17 ENCOUNTER — Ambulatory Visit: Payer: 59 | Admitting: Family Medicine

## 2021-08-25 ENCOUNTER — Other Ambulatory Visit: Payer: Self-pay

## 2021-08-25 ENCOUNTER — Encounter: Payer: Self-pay | Admitting: Emergency Medicine

## 2021-08-25 ENCOUNTER — Ambulatory Visit: Payer: 59 | Admitting: Emergency Medicine

## 2021-08-25 DIAGNOSIS — R911 Solitary pulmonary nodule: Secondary | ICD-10-CM | POA: Diagnosis not present

## 2021-08-25 NOTE — Progress Notes (Signed)
Subjective:    Patient ID: Jordan Graham, female    DOB: [birth year], 55 y.o.   MRN: [id number]  HPI 55 year old former smoker (6-7 pk-yrs) with a history of of migraines, CIN-1, low back pain, little other past medical history.  She is referred today to review a pulmonary nodule that was found on CT scan of the chest.  She reports that she has some R upper trapezius pain / soreness, can extend down to subscapular region. Denies any SOB, can notice some restriction on size of her breath when exerting. No change w stairs, hills. She lost about 20 lbs last yr, has gained it back now.   Coronary calcium scoring CT chest performed on 10/07/2019 reviewed by me, shows a calcium score of 0.  There was a 2 mm left lower lobe pulmonary nodule that did not necessarily merit follow-up.   Review of Systems As per HPI  Past Medical History:  Diagnosis Date   CIN I (cervical intraepithelial neoplasia I) 1992   tx with cryo   Lichen simplex chronicus    Migraine    with menses   Patella fracture 11/06/14   broken patella-just released from PT     Family History  Problem Relation Age of Onset   Thyroid cancer Father 18   Diabetes Father    Hypertension Father    Atrial fibrillation Father    Hypertension Mother    Migraines Mother    CAD Mother    Bladder Cancer Maternal Grandmother    Heart attack Maternal Grandmother 25       deceased   Pulmonary disease Maternal Grandmother    Pulmonary disease Paternal Grandfather    Diverticulitis Paternal Grandmother    Migraines Brother    Hypertension Brother    Other Paternal Aunt        mass found during endoscopy-deceased 2 days after procedure while in the hospital   Breast cancer Paternal Aunt    Hypertension Brother     Her maternal aunt had lung CA (non-smoker)  Social History   Socioeconomic History   Marital status: Married    Spouse name: Not on file   Number of children: Not on file   Years of education: Not on file    Highest education level: Not on file  Occupational History   Not on file  Tobacco Use   Smoking status: Former    Packs/day: 0.50    Years: 5.00    Pack years: 2.50    Types: Cigarettes   Smokeless tobacco: Never  Vaping Use   Vaping Use: Never used  Substance and Sexual Activity   Alcohol use: Yes    Alcohol/week: 0.0 - 1.0 standard drinks   Drug use: Never   Sexual activity: Yes    Partners: Male    Birth control/protection: Other-see comments, Post-menopausal    Comment: vasectomy  Other Topics Concern   Not on file  Social History Narrative   Not on file   Social Determinants of Health   Financial Resource Strain: Not on file  Food Insecurity: Not on file  Transportation Needs: Not on file  Physical Activity: Not on file  Stress: Not on file  Social Connections: Not on file  Intimate Partner Violence: Not on file    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 Some exposure to asbestos [place], [place] No military No other exposures except 2nd hand smoke.   No Known Allergies   Outpatient Medications Prior to Visit  Medication  Sig Dispense Refill   desvenlafaxine (PRISTIQ) 50 MG 24 hr tablet Take 50 mg by mouth daily.     gabapentin (NEURONTIN) 100 MG capsule Take 2 capsules (200 mg total) by mouth at bedtime. 180 capsule 0   NON FORMULARY Biote - Hormone replacement palette     progesterone (PROMETRIUM) 200 MG capsule Take 200 mg by mouth at bedtime.     sertraline (ZOLOFT) 100 MG tablet Take 150 mg by mouth daily.      VYVANSE 70 MG capsule Take 1 capsule by mouth daily.     No facility-administered medications prior to visit.         Objective:   Physical Exam  Vitals:   08/25/21 1047  BP: 118/76  Pulse: 79  Temp: 97.9 F (36.6 C)  TempSrc: Oral  SpO2: 100%  Weight: 197 lb 3.2 oz (89.4 kg)  Height: 5\' 6"  (1.676 m)    Gen: Pleasant, well-nourished, in no distress,  normal affect  ENT: No lesions,  mouth clear,  oropharynx clear, no postnasal  drip  Neck: No JVD, no stridor  Lungs: No use of accessory muscles, no crackles or wheezing on normal respiration, no wheeze on forced expiration  Cardiovascular: RRR, heart sounds normal, no murmur or gallops, no peripheral edema  Musculoskeletal: No deformities, no cyanosis or clubbing  Neuro: alert, awake, non focal  Skin: Warm, no lesions or rash     Assessment & Plan:  Lung nodule 2 mm left lower lobe pulmonary nodule and a low risk patient.  No dedicated follow-up was necessarily needed.  She is concerned about the nodule, the potential for others to be present in the nonvisualized lung.  We will plan to repeat in November which would be the 2-year mark.  If no other nodules present and this 1 is stable then she should not need any other follow-up.  We will get together to review after completed   December, MD, PhD 08/25/2021, 11:09 AM [facility] [phone] or if no answer before 7:00PM call [phone] For any issues after 7:00PM please call eLink [phone]

## 2021-08-25 NOTE — Addendum Note (Signed)
Addended by: Dorisann Frames R on: 08/25/2021 11:20 AM   Modules accepted: Orders

## 2021-08-25 NOTE — Assessment & Plan Note (Signed)
2 mm left lower lobe pulmonary nodule and a low risk patient.  No dedicated follow-up was necessarily needed.  She is concerned about the nodule, the potential for others to be present in the nonvisualized lung.  We will plan to repeat in November which would be the 2-year mark.  If no other nodules present and this 1 is stable then she should not need any other follow-up.  We will get together to review after completed

## 2021-08-25 NOTE — Patient Instructions (Signed)
We will arrange for a repeat CT scan of the chest without contrast in November 2022 to compare with your prior. Follow Dr. Delton Coombes in November after CT so that we can review the results together.

## 2021-08-31 NOTE — Progress Notes (Signed)
[name] [address] [place] [address] Phone: [phone] Subjective:   Jordan Graham, am serving as a scribe for Dr. Antoine Primas.  This visit occurred during the SARS-CoV-2 public health emergency.  Safety protocols were in place, including screening questions prior to the visit, additional usage of staff PPE, and extensive cleaning of exam room while observing appropriate contact time as indicated for disinfecting solutions.   I'm seeing this patient by the request  of:  Lewis Moccasin, MD  CC: Back and neck pain follow-up  TDV:[id number]  07/13/2021 Patient does have low back pain.  Discussed with patient at great length.  Discussed icing regimen and home exercises.  Discussed which activities to do and which ones to avoid.  Increase activity slowly.  Patient will increase activity slowly.  X-rays are pending.  Started on gabapentin.  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  Discussed with patient about proper shoes, topical anti-inflammatories, icing regimen.  Increase activity slowly.  Updated 09/01/2021 Jordan Graham is a 55 y.o. female coming in with complaint of LBP, left knee pain, and left foot pain. Patient ordered HOKA shoes but they have not come in yet. Wearing OOFOS in house but they are causing midfoot pain. They feel good otherwise on her feet and great toe pain. Does note great toe has been numb over top of toe. Has been doing HEP.   Left knee pain has been better since last visit. Has not had any feelings of instability since last visit. Does note hip popping that occurs intermittently. Patient is interested in starting Pilates.   Continued lower back pain.   Xray of spine and hip both (-) these were independently visualized by me today.  No significant bony  abnormality of either the low back or the hip.      Past Medical History:  Diagnosis Date   CIN I (cervical intraepithelial neoplasia I) 1992   tx with cryo   Lichen simplex chronicus    Migraine    with menses   Patella fracture 11/06/14   broken patella-just released from PT   Past Surgical History:  Procedure Laterality Date   BREAST BIOPSY Left    ? about 25 years ago   BREAST SURGERY     GYNECOLOGIC CRYOSURGERY     Social History   Socioeconomic History   Marital status: Married    Spouse name: Not on file   Number of children: Not on file   Years of education: Not on file   Highest education level: Not on file  Occupational History   Not on file  Tobacco Use   Smoking status: Former    Packs/day: 0.50    Years: 5.00    Pack years: 2.50    Types: Cigarettes   Smokeless tobacco: Never  Vaping Use   Vaping Use: Never used  Substance and Sexual Activity   Alcohol use: Yes    Alcohol/week: 0.0 - 1.0 standard drinks   Drug use: Never   Sexual activity: Yes    Partners: Male    Birth control/protection: Other-see comments, Post-menopausal    Comment: vasectomy  Other Topics Concern   Not on file  Social History Narrative   Not on file   Social Determinants of Corporate investment banker  Strain: Not on file  Food Insecurity: Not on file  Transportation Needs: Not on file  Physical Activity: Not on file  Stress: Not on file  Social Connections: Not on file   No Known Allergies Family History  Problem Relation Age of Onset   Thyroid cancer Father 81   Diabetes Father    Hypertension Father    Atrial fibrillation Father    Hypertension Mother    Migraines Mother    CAD Mother    Bladder Cancer Maternal Grandmother    Heart attack Maternal Grandmother 52       deceased   Pulmonary disease Maternal Grandmother    Pulmonary disease Paternal Grandfather    Diverticulitis Paternal Grandmother    Migraines Brother    Hypertension Brother    Other  Paternal Aunt        mass found during endoscopy-deceased 2 days after procedure while in the hospital   Breast cancer Paternal Aunt    Hypertension Brother     Current Outpatient Medications (Endocrine & Metabolic):    progesterone (PROMETRIUM) 200 MG capsule, Take 200 mg by mouth at bedtime.      Current Outpatient Medications (Other):    desvenlafaxine (PRISTIQ) 50 MG 24 hr tablet, Take 50 mg by mouth daily.   gabapentin (NEURONTIN) 100 MG capsule, Take 2 capsules (200 mg total) by mouth at bedtime.   NON FORMULARY, Biote - Hormone replacement palette   sertraline (ZOLOFT) 100 MG tablet, Take 150 mg by mouth daily.    VYVANSE 70 MG capsule, Take 1 capsule by mouth daily.   Reviewed prior external information including notes and imaging from  primary care provider As well as notes that were available from care everywhere and other healthcare systems.  Past medical history, social, surgical and family history all reviewed in electronic medical record.  No pertanent information unless stated regarding to the chief complaint.   Review of Systems:  No headache, visual changes, nausea, vomiting, diarrhea, constipation, dizziness, abdominal pain, skin rash, fevers, chills, night sweats, weight loss, swollen lymph nodes, body aches, joint swelling, chest pain, shortness of breath, mood changes. POSITIVE muscle aches  Objective  Blood pressure 132/82, pulse 96, height 5\' 6"  (1.676 m), SpO2 98 %.   General: No apparent distress alert and oriented x3 mood and affect normal, dressed appropriately.  HEENT: Pupils equal, extraocular movements intact  Respiratory: Patient's speak in full sentences and does not appear short of breath  Cardiovascular: No lower extremity edema, non tender, no erythema  Gait normal with good balance and coordination.  MSK: Foot exam shows the patient does have breakdown of transverse arch bilaterally.  Patient does have bunion and bunionette formation noted.   Mild hallux limitus noted bilaterally. Low back exam does have loss of lordosis.  Some tenderness to palpation in the paraspinal musculature.  Tightness noted in the parascapular region bilaterally.  Tightness noted in the left side of the neck more in the C5-C6 region.  Negative Spurling's.  5-5 strength of all the extremities.   Limited muscular skeletal ultrasound was performed and interpreted by , M  Limited ultrasound of patient's foot shows that there is less hypoechoic changes from previous exam of the first metatarsal.  Patient also has no significant hypoechoic changes that would be consistent with a neuroma at the moment. Impression: Interval improvement  Osteopathic findings C6 flexed rotated and side bent left T6 extended rotated and side bent left inhaled rib  L2 flexed rotated and side  bent right Sacrum right on right    Impression and Recommendations:     The above documentation has been reviewed and is accurate and complete Judi Saa, DO

## 2021-09-01 ENCOUNTER — Other Ambulatory Visit: Payer: Self-pay

## 2021-09-01 ENCOUNTER — Ambulatory Visit: Payer: 59 | Admitting: Family Medicine

## 2021-09-01 ENCOUNTER — Ambulatory Visit: Payer: Self-pay

## 2021-09-01 ENCOUNTER — Encounter: Payer: Self-pay | Admitting: Family Medicine

## 2021-09-01 VITALS — BP 132/82 | HR 96 | Ht 66.0 in

## 2021-09-01 DIAGNOSIS — M216X2 Other acquired deformities of left foot: Secondary | ICD-10-CM

## 2021-09-01 DIAGNOSIS — M9902 Segmental and somatic dysfunction of thoracic region: Secondary | ICD-10-CM

## 2021-09-01 DIAGNOSIS — M9908 Segmental and somatic dysfunction of rib cage: Secondary | ICD-10-CM

## 2021-09-01 DIAGNOSIS — M545 Low back pain, unspecified: Secondary | ICD-10-CM

## 2021-09-01 DIAGNOSIS — G8929 Other chronic pain: Secondary | ICD-10-CM

## 2021-09-01 DIAGNOSIS — E663 Overweight: Secondary | ICD-10-CM

## 2021-09-01 DIAGNOSIS — M9904 Segmental and somatic dysfunction of sacral region: Secondary | ICD-10-CM

## 2021-09-01 DIAGNOSIS — M9901 Segmental and somatic dysfunction of cervical region: Secondary | ICD-10-CM

## 2021-09-01 DIAGNOSIS — M79672 Pain in left foot: Secondary | ICD-10-CM

## 2021-09-01 DIAGNOSIS — M9903 Segmental and somatic dysfunction of lumbar region: Secondary | ICD-10-CM | POA: Insufficient documentation

## 2021-09-01 NOTE — Assessment & Plan Note (Signed)
Low back pain is likely multifactorial.  Likely secondary to patient's body habitus, muscle imbalances and potentially from patient's feet.  Hopefully with making the changes in the foot where we will notice some improvement.  In addition to this we discussed icing regimen and home exercises.  Discussed which activities to do which wants to avoid.  Increase activity slowly.  Patient responded well to osteopathic manipulation today.  Follow-up again in 6 weeks

## 2021-09-01 NOTE — Assessment & Plan Note (Signed)
Encourage patient to try the lower impact exercises and patient will try Pilates.  We will see how patient responds and then otherwise we will consider the possibility of healthy weight and wellness

## 2021-09-01 NOTE — Patient Instructions (Signed)
HOKA recovery sandals Try Pilates Tried OMT See me in 6 weeks

## 2021-09-01 NOTE — Assessment & Plan Note (Signed)
Ultrasound done again today and does show some decrease in the hypoechoic changes that was seen previously.  Patient still has having some discomfort but has not made any changes to the Sushanth.  Encourage patient to continue to monitor as well as wearing proper shoes that I think will be beneficial.  Follow-up with me again in 6 to 8 weeks

## 2021-09-01 NOTE — Assessment & Plan Note (Signed)

## 2021-10-01 NOTE — Progress Notes (Signed)
[name] [address] [place] [address] Phone: [phone] Subjective:   Bruce Donath, am serving as a scribe for Dr. Antoine Primas. This visit occurred during the SARS-CoV-2 public health emergency.  Safety protocols were in place, including screening questions prior to the visit, additional usage of staff PPE, and extensive cleaning of exam room while observing appropriate contact time as indicated for disinfecting solutions.   I'm seeing this patient by the request  of:  Lewis Moccasin, MD  CC: left foot  pain  [id number]  OMT on 09/01/2021 Low back pain is likely multifactorial.  Likely secondary to patient's body habitus, muscle imbalances and potentially from patient's feet.  Hopefully with making the changes in the foot where we will notice some improvement.  In addition to this we discussed icing regimen and home exercises.  Discussed which activities to do which wants to avoid.  Increase activity slowly.  Patient responded well to osteopathic manipulation today.  Follow-up again in 6 weeks  Ultrasound done again today and does show some decrease in the hypoechoic changes that was seen previously.  Patient still has having some discomfort but has not made any changes to the Sushanth.  Encourage patient to continue to monitor as well as wearing proper shoes that I think will be beneficial.  Follow-up with me again in 6 to 8 weeks  Encourage patient to try the lower impact exercises and patient will try Pilates.  We will see how patient responds and then otherwise we will consider the possibility of healthy weight and wellness  Updated 10/04/2021 Rennae Ferraiolo Friend is a 55 y.o. female coming in with complaint of left foot pain. Patient states that her foot pain is worse. Pain over 1st MTP and base of 5th metatarsal. Having more swelling.  Wearing the new shows shoes.    Starting to have R MTP joint pain.        Past Medical  History:  Diagnosis Date   CIN I (cervical intraepithelial neoplasia I) 1992   tx with cryo   Lichen simplex chronicus    Migraine    with menses   Patella fracture 11/06/14   broken patella-just released from PT   Past Surgical History:  Procedure Laterality Date   BREAST BIOPSY Left    ? about 25 years ago   BREAST SURGERY     GYNECOLOGIC CRYOSURGERY     Social History   Socioeconomic History   Marital status: Married    Spouse name: Not on file   Number of children: Not on file   Years of education: Not on file   Highest education level: Not on file  Occupational History   Not on file  Tobacco Use   Smoking status: Former    Packs/day: 0.50    Years: 5.00    Pack years: 2.50    Types: Cigarettes   Smokeless tobacco: Never  Vaping Use   Vaping Use: Never used  Substance and Sexual Activity   Alcohol use: Yes    Alcohol/week: 0.0 - 1.0 standard drinks   Drug use: Never   Sexual activity: Yes    Partners: Male    Birth control/protection: Other-see comments, Post-menopausal    Comment: vasectomy  Other Topics Concern   Not on file  Social History Narrative   Not on file   Social Determinants of Health   Financial Resource Strain: Not on file  Food Insecurity: Not on file  Transportation Needs:  Not on file  Physical Activity: Not on file  Stress: Not on file  Social Connections: Not on file   No Known Allergies Family History  Problem Relation Age of Onset   Thyroid cancer Father 52   Diabetes Father    Hypertension Father    Atrial fibrillation Father    Hypertension Mother    Migraines Mother    CAD Mother    Bladder Cancer Maternal Grandmother    Heart attack Maternal Grandmother 70       deceased   Pulmonary disease Maternal Grandmother    Pulmonary disease Paternal Grandfather    Diverticulitis Paternal Grandmother    Migraines Brother    Hypertension Brother    Other Paternal Aunt        mass found during endoscopy-deceased 2 days  after procedure while in the hospital   Breast cancer Paternal Aunt    Hypertension Brother     Current Outpatient Medications (Endocrine & Metabolic):    progesterone (PROMETRIUM) 200 MG capsule, Take 200 mg by mouth at bedtime.      Current Outpatient Medications (Other):    desvenlafaxine (PRISTIQ) 50 MG 24 hr tablet, Take 50 mg by mouth daily.   gabapentin (NEURONTIN) 100 MG capsule, Take 2 capsules (200 mg total) by mouth at bedtime.   NON FORMULARY, Biote - Hormone replacement palette   sertraline (ZOLOFT) 100 MG tablet, Take 150 mg by mouth daily.    VYVANSE 70 MG capsule, Take 1 capsule by mouth daily.   Reviewed prior external information including notes and imaging from  primary care provider As well as notes that were available from care everywhere and other healthcare systems.  Past medical history, social, surgical and family history all reviewed in electronic medical record.  No pertanent information unless stated regarding to the chief complaint.   Review of Systems:  No headache, visual changes, nausea, vomiting, diarrhea, constipation, dizziness, abdominal pain, skin rash, fevers, chills, night sweats, weight loss, swollen lymph nodes, , joint swelling, chest pain, shortness of breath, mood changes. POSITIVE muscle aches, body aches  Objective  Blood pressure (!) 148/108, pulse (!) 101, height 5\' 6"  (1.676 m), weight 193 lb (87.5 kg), SpO2 98 %.   General: No apparent distress alert and oriented x3 mood and affect normal, dressed appropriately.  Overweight HEENT: Pupils equal, extraocular movements intact  Respiratory: Patient's speak in full sentences and does not appear short of breath  Cardiovascular: No lower extremity edema, non tender, no erythema  MSK: Patient's foot exam shows the patient does have some increasing inflammation noted of the first MTP on the left side.  Mild hallux limitus noted.  Patient is tender to palpation in this area.  Breakdown of  the transverse arch noted bilaterally fairly significant with overpronation of the hindfoot  Limited muscular skeletal ultrasound was performed and interpreted by , M  Limited ultrasound shows the patient either has a potential loose body or calcific bunion formation noted over the first MTP on the left side.  In addition to this patient does have hypoechoic changes superficial to this calcific area that is consistent with more of a blister formation.  Increasing in Doppler flow in the area. Impression: Calcific bunion of the first toe with likely blister formation noted.   Impression and Recommendations:     The above documentation has been reviewed and is accurate and complete Antoine Primas, DO

## 2021-10-04 ENCOUNTER — Other Ambulatory Visit: Payer: Self-pay

## 2021-10-04 ENCOUNTER — Ambulatory Visit: Payer: 59 | Admitting: Family Medicine

## 2021-10-04 ENCOUNTER — Encounter: Payer: Self-pay | Admitting: Family Medicine

## 2021-10-04 ENCOUNTER — Ambulatory Visit: Payer: Self-pay

## 2021-10-04 ENCOUNTER — Ambulatory Visit (INDEPENDENT_AMBULATORY_CARE_PROVIDER_SITE_OTHER): Payer: 59

## 2021-10-04 VITALS — BP 148/108 | HR 101 | Ht 66.0 in | Wt 193.0 lb

## 2021-10-04 DIAGNOSIS — M545 Low back pain, unspecified: Secondary | ICD-10-CM

## 2021-10-04 DIAGNOSIS — M79671 Pain in right foot: Secondary | ICD-10-CM

## 2021-10-04 DIAGNOSIS — M79672 Pain in left foot: Secondary | ICD-10-CM

## 2021-10-04 DIAGNOSIS — E663 Overweight: Secondary | ICD-10-CM

## 2021-10-04 DIAGNOSIS — M216X2 Other acquired deformities of left foot: Secondary | ICD-10-CM | POA: Diagnosis not present

## 2021-10-04 DIAGNOSIS — G8929 Other chronic pain: Secondary | ICD-10-CM

## 2021-10-04 NOTE — Patient Instructions (Addendum)
Xray today Custom orthotics Ice toe Avoid barefoot Omega 3 more than Omega 6 More water Probiotic 10 diff strains 10 billion units See me in 6 weeks

## 2021-10-04 NOTE — Assessment & Plan Note (Signed)
Patient has not been able to do the walking.  Likely contributing to some of it as well.  Discussed icing regimen and home exercises.  Increase activity slowly.  Follow-up again in 6 to 8 weeks.

## 2021-10-04 NOTE — Assessment & Plan Note (Signed)
Continues to have difficulty with the first MTP joint.  We will get x-rays.  Will refer to custom orthotics that I think will be more beneficial and more dynamic for this individual.  Continue to wear the recovery sandals in the house.  Can take the gabapentin for this and the low back pain.  Follow-up again in 4 to 8 weeks

## 2021-10-04 NOTE — Assessment & Plan Note (Signed)
Patient is overweight, BMI is over 31 at this point.  Having significant difficulty with losing weight and will refer patient to healthy weight and wellness.

## 2021-11-04 NOTE — Progress Notes (Deleted)
[facility] [address] [place] [address] Phone: [phone] Subjective:    I'm seeing this patient by the request  of:  Jordan Moccasin, MD  CC:   [id number]  10/04/2021 Patient has not been able to do the walking.  Likely contributing to some of it as well.  Discussed icing regimen and home exercises.  Increase activity slowly.  Follow-up again in 6 to 8 weeks.  Continues to have difficulty with the first MTP joint.  We will get x-rays.  Will refer to custom orthotics that I think will be more beneficial and more dynamic for this individual.  Continue to wear the recovery sandals in the house.  Can take the gabapentin for this and the low back pain.  Follow-up again in 4 to 8 weeks  Patient is overweight, BMI is over 31 at this point.  Having significant difficulty with losing weight and will refer patient to healthy weight and wellness.  Updated 11/09/2021 Jordan Graham is a 55 y.o. female coming in with complaint of left foot and LBP.  Onset-  Location Duration-  Character- Aggravating factors- Reliving factors-  Therapies tried-  Severity-     Past Medical History:  Diagnosis Date   CIN I (cervical intraepithelial neoplasia I) 1992   tx with cryo   Lichen simplex chronicus    Migraine    with menses   Patella fracture 11/06/14   broken patella-just released from PT   Past Surgical History:  Procedure Laterality Date   BREAST BIOPSY Left    ? about 25 years ago   BREAST SURGERY     GYNECOLOGIC CRYOSURGERY     Social History   Socioeconomic History   Marital status: Married    Spouse name: Not on file   Number of children: Not on file   Years of education: Not on file   Highest education level: Not on file  Occupational History   Not on file  Tobacco Use   Smoking status: Former    Packs/day: 0.50    Years: 5.00    Pack years: 2.50    Types: Cigarettes   Smokeless tobacco: Never  Vaping Use   Vaping  Use: Never used  Substance and Sexual Activity   Alcohol use: Yes    Alcohol/week: 0.0 - 1.0 standard drinks   Drug use: Never   Sexual activity: Yes    Partners: Male    Birth control/protection: Other-see comments, Post-menopausal    Comment: vasectomy  Other Topics Concern   Not on file  Social History Narrative   Not on file   Social Determinants of Health   Financial Resource Strain: Not on file  Food Insecurity: Not on file  Transportation Needs: Not on file  Physical Activity: Not on file  Stress: Not on file  Social Connections: Not on file   No Known Allergies Family History  Problem Relation Age of Onset   Thyroid cancer Father 45   Diabetes Father    Hypertension Father    Atrial fibrillation Father    Hypertension Mother    Migraines Mother    CAD Mother    Bladder Cancer Maternal Grandmother    Heart attack Maternal Grandmother 62       deceased   Pulmonary disease Maternal Grandmother    Pulmonary disease Paternal Grandfather    Diverticulitis Paternal Grandmother    Migraines Brother    Hypertension Brother    Other Paternal Aunt  mass found during endoscopy-deceased 2 days after procedure while in the hospital   Breast cancer Paternal Aunt    Hypertension Brother     Current Outpatient Medications (Endocrine & Metabolic):    progesterone (PROMETRIUM) 200 MG capsule, Take 200 mg by mouth at bedtime.      Current Outpatient Medications (Other):    desvenlafaxine (PRISTIQ) 50 MG 24 hr tablet, Take 50 mg by mouth daily.   gabapentin (NEURONTIN) 100 MG capsule, Take 2 capsules (200 mg total) by mouth at bedtime.   NON FORMULARY, Biote - Hormone replacement palette   sertraline (ZOLOFT) 100 MG tablet, Take 150 mg by mouth daily.    VYVANSE 70 MG capsule, Take 1 capsule by mouth daily.   Reviewed prior external information including notes and imaging from  primary care provider As well as notes that were available from care everywhere and  other healthcare systems.  Past medical history, social, surgical and family history all reviewed in electronic medical record.  No pertanent information unless stated regarding to the chief complaint.   Review of Systems:  No headache, visual changes, nausea, vomiting, diarrhea, constipation, dizziness, abdominal pain, skin rash, fevers, chills, night sweats, weight loss, swollen lymph nodes, body aches, joint swelling, chest pain, shortness of breath, mood changes. POSITIVE muscle aches  Objective  There were no vitals taken for this visit.   General: No apparent distress alert and oriented x3 mood and affect normal, dressed appropriately.  HEENT: Pupils equal, extraocular movements intact  Respiratory: Patient's speak in full sentences and does not appear short of breath  Cardiovascular: No lower extremity edema, non tender, no erythema  Gait normal with good balance and coordination.  MSK:  Non tender with full range of motion and good stability and symmetric strength and tone of shoulders, elbows, wrist, hip, knee and ankles bilaterally.     Impression and Recommendations:     The above documentation has been reviewed and is accurate and complete [facility]

## 2021-11-09 ENCOUNTER — Ambulatory Visit: Payer: 59 | Admitting: Family Medicine

## 2021-11-25 NOTE — Progress Notes (Signed)
[name] [address] [place] [address] Phone: [phone] Subjective:   [name], am serving as a scribe for Dr. Antoine Primas. This visit occurred during the SARS-CoV-2 public health emergency.  Safety protocols were in place, including screening questions prior to the visit, additional usage of staff PPE, and extensive cleaning of exam room while observing appropriate contact time as indicated for disinfecting solutions.   I'm seeing this patient by the request  of:  Jordan Moccasin, MD  CC: Foot pain  [id number]  10/04/2021 Patient has not been able to do the walking.  Likely contributing to some of it as well.  Discussed icing regimen and home exercises.  Increase activity slowly.  Follow-up again in 6 to 8 weeks.  Continues to have difficulty with the first MTP joint.  We will get x-rays.  Will refer to custom orthotics that I think will be more beneficial and more dynamic for this individual.  Continue to wear the recovery sandals in the house.  Can take the gabapentin for this and the low back pain.  Follow-up again in 4 to 8 weeks  Patient is overweight, BMI is over 31 at this point.  Having significant difficulty with losing weight and will refer patient to healthy weight and wellness.  Updated 11/26/2021 Jordan [facility] is a 56 y.o. female coming in with complaint of foot pain worse than the last visit. Palm of foot has gotten tender. Now pain near lateral malleolus. Pain comes when being on feet for a while. No other complaints.       Past Medical History:  Diagnosis Date   CIN I (cervical intraepithelial neoplasia I) 1992   tx with cryo   Lichen simplex chronicus    Migraine    with menses   Patella fracture 11/06/14   broken patella-just released from PT   Past Surgical History:  Procedure Laterality Date   BREAST BIOPSY Left    ? about 25 years ago   BREAST SURGERY     GYNECOLOGIC CRYOSURGERY     Social  History   Socioeconomic History   Marital status: Married    Spouse name: Not on file   Number of children: Not on file   Years of education: Not on file   Highest education level: Not on file  Occupational History   Not on file  Tobacco Use   Smoking status: Former    Packs/day: 0.50    Years: 5.00    Pack years: 2.50    Types: Cigarettes   Smokeless tobacco: Never  Vaping Use   Vaping Use: Never used  Substance and Sexual Activity   Alcohol use: Yes    Alcohol/week: 0.0 - 1.0 standard drinks   Drug use: Never   Sexual activity: Yes    Partners: Male    Birth control/protection: Other-see comments, Post-menopausal    Comment: vasectomy  Other Topics Concern   Not on file  Social History Narrative   Not on file   Social Determinants of Health   Financial Resource Strain: Not on file  Food Insecurity: Not on file  Transportation Needs: Not on file  Physical Activity: Not on file  Stress: Not on file  Social Connections: Not on file   No Known Allergies Family History  Problem Relation Age of Onset   Thyroid cancer Father 91   Diabetes Father    Hypertension Father    Atrial fibrillation Father    Hypertension  Mother    Migraines Mother    CAD Mother    Bladder Cancer Maternal Grandmother    Heart attack Maternal Grandmother 65       deceased   Pulmonary disease Maternal Grandmother    Pulmonary disease Paternal Grandfather    Diverticulitis Paternal Grandmother    Migraines Brother    Hypertension Brother    Other Paternal Aunt        mass found during endoscopy-deceased 2 days after procedure while in the hospital   Breast cancer Paternal Aunt    Hypertension Brother     Current Outpatient Medications (Endocrine & Metabolic):    progesterone (PROMETRIUM) 200 MG capsule, Take 200 mg by mouth at bedtime.      Current Outpatient Medications (Other):    desvenlafaxine (PRISTIQ) 50 MG 24 hr tablet, Take 50 mg by mouth daily.   gabapentin  (NEURONTIN) 100 MG capsule, Take 2 capsules (200 mg total) by mouth at bedtime.   NON FORMULARY, Biote - Hormone replacement palette   sertraline (ZOLOFT) 100 MG tablet, Take 150 mg by mouth daily.    VYVANSE 70 MG capsule, Take 1 capsule by mouth daily.   Reviewed prior external information including notes and imaging from  primary care provider As well as notes that were available from care everywhere and other healthcare systems.  Past medical history, social, surgical and family history all reviewed in electronic medical record.  No pertanent information unless stated regarding to the chief complaint.   Review of Systems:  No headache, visual changes, nausea, vomiting, diarrhea, constipation, dizziness, abdominal pain, skin rash, fevers, chills, night sweats, weight loss, swollen lymph nodes, body aches, joint swelling, chest pain, shortness of breath, mood changes. POSITIVE muscle aches  Objective  Blood pressure 128/80, pulse 86, height 5\' 6"  (1.676 m), weight 200 lb (90.7 kg), SpO2 97 %.   General: No apparent distress alert and oriented x3 mood and affect normal, dressed appropriately.  HEENT: Pupils equal, extraocular movements intact  Respiratory: Patient's speak in full sentences and does not appear short of breath  Cardiovascular: No lower extremity edema, non tender, no erythema  Gait normal with good balance and coordination.  MSK: Foot exam shows the patient does have breakdown of the transverse arch bilaterally.  Patient does have breakdown of the first metatarsal noted.  Patient does have bunion and bunionette formation with fibular deviation of the toe on the left foot.  Tender to palpation.  Patient does have tenderness to palpation over the peroneal tendons as well.  Limited muscular skeletal ultrasound was performed and interpreted by , M  Limited ultrasound shows the patient does have moderate arthritic changes of the left MTP joint.  Trace effusion  noted.  Patient also has some peroneal subluxation and appears after manipulation did have proper alignment of the peroneal brevis and longus.  Does have hypoechoic changes within the tendon sheath. Impression: First MTP arthritis with peroneal tendinitis   Impression and Recommendations:     The above documentation has been reviewed and is accurate and complete Antoine Primas, DO

## 2021-11-26 ENCOUNTER — Other Ambulatory Visit: Payer: Self-pay

## 2021-11-26 ENCOUNTER — Encounter: Payer: Self-pay | Admitting: Family Medicine

## 2021-11-26 ENCOUNTER — Ambulatory Visit: Payer: 59 | Admitting: Family Medicine

## 2021-11-26 ENCOUNTER — Ambulatory Visit: Payer: Self-pay

## 2021-11-26 VITALS — BP 128/80 | HR 86 | Ht 66.0 in | Wt 200.0 lb

## 2021-11-26 DIAGNOSIS — M19079 Primary osteoarthritis, unspecified ankle and foot: Secondary | ICD-10-CM | POA: Diagnosis not present

## 2021-11-26 DIAGNOSIS — M7672 Peroneal tendinitis, left leg: Secondary | ICD-10-CM

## 2021-11-26 DIAGNOSIS — M216X2 Other acquired deformities of left foot: Secondary | ICD-10-CM

## 2021-11-26 NOTE — Assessment & Plan Note (Signed)
Patient does have some arthritis that is moderate in nature of the first metatarsal.  Patient does have subluxation noted.  Significant breakdown of the transverse arch.  Patient has been having this difficulty for quite some time and would like to see if surgical intervention would be potentially helpful.  Concern for the bunion formation.  Patient will be referred to discuss possible surgical intervention.

## 2021-11-26 NOTE — Assessment & Plan Note (Signed)
Still encourage patient to get the custom orthotics.  Still think it could help decrease the progression of any more arthritis or bunion formation.

## 2021-11-26 NOTE — Patient Instructions (Addendum)
Get the orthotics Body Helix x active brace Do prescribed exercises at least 3x a week  Dhea 50mg  daily but don't do until done with pellets See you again in 6 weeks

## 2021-11-26 NOTE — Assessment & Plan Note (Signed)
Patient given exercises today, discussed bracing, would not do a heel lift secondary to improper loading in the first metatarsal.  Discussed certain activities that I think will be beneficial.  Discussed avoiding certain shoes.  Follow-up again in 4 to 8 weeks

## 2022-01-07 ENCOUNTER — Ambulatory Visit: Payer: 59 | Admitting: Family Medicine

## 2022-02-03 NOTE — Progress Notes (Signed)
?[name] D.O. ?[facility] ?[address] [place] [address] ?Phone: [phone] ?Subjective:   ?I, [name], am serving as a scribe for Dr. Antoine Primas. ?This visit occurred during the SARS-CoV-2 public health emergency.  Safety protocols were in place, including screening questions prior to the visit, additional usage of staff PPE, and extensive cleaning of exam room while observing appropriate contact time as indicated for disinfecting solutions.  ? ? ?I'm seeing this patient by the request  of:  Lewis Moccasin, MD ? ?CC: Ankle pain follow-up ? ?[id number]  ?11/26/2021 ?Patient does have some arthritis that is moderate in nature of the first metatarsal.  Patient does have subluxation noted.  Significant breakdown of the transverse arch.  Patient has been having this difficulty for quite some time and would like to see if surgical intervention would be potentially helpful.  Concern for the bunion formation.  Patient will be referred to discuss possible surgical intervention. ? ?Still encourage patient to get the custom orthotics.  Still think it could help decrease the progression of any more arthritis or bunion formation ? ?Patient given exercises today, discussed bracing, would not do a heel lift secondary to improper loading in the first metatarsal.  Discussed certain activities that I think will be beneficial.  Discussed avoiding certain shoes.  Follow-up again in 4 to 8 weeks ? ?Update 02/04/2022 ?Jordan Graham is a 56 y.o. female coming in with complaint of L ankle pain and arthritis of 1st MTP. Patient states that she is improving. Had reflexology and now her ROM is better. Having pain over 5th metatarsal. Has not made appt to get new inserts made.  ? ? ? ?  ? ?Past Medical History:  ?Diagnosis Date  ? CIN I (cervical intraepithelial neoplasia I) 1992  ? tx with cryo  ? Lichen simplex chronicus   ? Migraine   ? with menses  ? Patella fracture 11/06/14  ? broken  patella-just released from PT  ? ?Past Surgical History:  ?Procedure Laterality Date  ? BREAST BIOPSY Left   ? ? about 25 years ago  ? BREAST SURGERY    ? GYNECOLOGIC CRYOSURGERY    ? ?Social History  ? ?Socioeconomic History  ? Marital status: Married  ?  Spouse name: Not on file  ? Number of children: Not on file  ? Years of education: Not on file  ? Highest education level: Not on file  ?Occupational History  ? Not on file  ?Tobacco Use  ? Smoking status: Former  ?  Packs/day: 0.50  ?  Years: 5.00  ?  Pack years: 2.50  ?  Types: Cigarettes  ? Smokeless tobacco: Never  ?Vaping Use  ? Vaping Use: Never used  ?Substance and Sexual Activity  ? Alcohol use: Yes  ?  Alcohol/week: 0.0 - 1.0 standard drinks  ? Drug use: Never  ? Sexual activity: Yes  ?  Partners: Male  ?  Birth control/protection: Other-see comments, Post-menopausal  ?  Comment: vasectomy  ?Other Topics Concern  ? Not on file  ?Social History Narrative  ? Not on file  ? ?Social Determinants of Health  ? ?Financial Resource Strain: Not on file  ?Food Insecurity: Not on file  ?Transportation Needs: Not on file  ?Physical Activity: Not on file  ?Stress: Not on file  ?Social Connections: Not on file  ? ?No Known Allergies ?Family History  ?Problem Relation Age of Onset  ? Thyroid cancer Father 56  ? Diabetes  Father   ? Hypertension Father   ? Atrial fibrillation Father   ? Hypertension Mother   ? Migraines Mother   ? CAD Mother   ? Bladder Cancer Maternal Grandmother   ? Heart attack Maternal Grandmother 29  ?     deceased  ? Pulmonary disease Maternal Grandmother   ? Pulmonary disease Paternal Grandfather   ? Diverticulitis Paternal Grandmother   ? Migraines Brother   ? Hypertension Brother   ? Other Paternal Aunt   ?     mass found during endoscopy-deceased 2 days after procedure while in the hospital  ? Breast cancer Paternal Aunt   ? Hypertension Brother   ? ? ?Current Outpatient Medications (Endocrine & Metabolic):  ?  progesterone (PROMETRIUM) 200 MG  capsule, Take 200 mg by mouth at bedtime. ? ? ? ? ? ?Current Outpatient Medications (Other):  ?  desvenlafaxine (PRISTIQ) 50 MG 24 hr tablet, Take 50 mg by mouth daily. ?  gabapentin (NEURONTIN) 100 MG capsule, Take 2 capsules (200 mg total) by mouth at bedtime. ?  NON FORMULARY, Biote - Hormone replacement palette ?  sertraline (ZOLOFT) 100 MG tablet, Take 150 mg by mouth daily.  ?  VYVANSE 70 MG capsule, Take 1 capsule by mouth daily. ? ?  ? ?Objective  ?Blood pressure (!) 120/92, pulse 80, height 5\' 6"  (1.676 m), weight 193 lb (87.5 kg), SpO2 98 %. ?  ?General: No apparent distress alert and oriented x3 mood and affect normal, dressed appropriately.  ?HEENT: Pupils equal, extraocular movements intact  ?Respiratory: Patient's speak in full sentences and does not appear short of breath  ?Cardiovascular: No lower extremity edema, non tender, no erythema  ?Gait normal with good balance and coordination.  ?MSK: Ankle exam does have some loss of lordosis.  Some tenderness to palpation only over the peroneal tendons at this time. ? ?  ?Impression and Recommendations:  ?  ? ?The above documentation has been reviewed and is accurate and complete , DO ? ? ? ?

## 2022-02-04 ENCOUNTER — Ambulatory Visit: Payer: Self-pay

## 2022-02-04 ENCOUNTER — Other Ambulatory Visit: Payer: Self-pay

## 2022-02-04 ENCOUNTER — Encounter: Payer: Self-pay | Admitting: Family Medicine

## 2022-02-04 ENCOUNTER — Ambulatory Visit: Payer: 59 | Admitting: Family Medicine

## 2022-02-04 VITALS — BP 120/92 | HR 80 | Ht 66.0 in | Wt 193.0 lb

## 2022-02-04 DIAGNOSIS — M25572 Pain in left ankle and joints of left foot: Secondary | ICD-10-CM | POA: Diagnosis not present

## 2022-02-04 DIAGNOSIS — M19079 Primary osteoarthritis, unspecified ankle and foot: Secondary | ICD-10-CM

## 2022-02-04 NOTE — Assessment & Plan Note (Signed)
Patient is doing relatively well at this time.  Discussed icing regimen and home exercises.  I would not change anything else more specifically.  Patient has been a little noncompliant and has not followed up to get the custom orthotics which I think will be the most beneficial thing she can do.  Otherwise patient will continue to stay active.  Follow-up with me again in 2 to 3 months ?

## 2022-02-04 NOTE — Patient Instructions (Addendum)
Good to see you! ?Will continue to do better ?See you again in 3 months ?

## 2022-04-21 NOTE — Progress Notes (Unsigned)
[name] [address] [place] [address] Phone: [phone] Subjective:   [facility], am serving as a scribe for Dr. Antoine Primas.  I'm seeing this patient by the request  of:  Lewis Moccasin, MD  CC: left ankle and 1st toe pain   [id number]  02/04/2022 Patient is doing relatively well at this time.  Discussed icing regimen and home exercises.  I would not change anything else more specifically.  Patient has been a little noncompliant and has not followed up to get the custom orthotics which I think will be the most beneficial thing she can do.  Otherwise patient will continue to stay active.  Follow-up with me again in 2 to 3 months   Jordan Graham is a 56 y.o. female coming in with complaint of L ankle and 1st MTP joint. Patient states doing better, but has been barefoot at home. Pilates and massage has been helping. The pain isn't stopping her from doing activity. Its manageable at this point.      Past Medical History:  Diagnosis Date   CIN I (cervical intraepithelial neoplasia I) 1992   tx with cryo   Lichen simplex chronicus    Migraine    with menses   Patella fracture 11/06/14   broken patella-just released from PT   Past Surgical History:  Procedure Laterality Date   BREAST BIOPSY Left    ? about 25 years ago   BREAST SURGERY     GYNECOLOGIC CRYOSURGERY     Social History   Socioeconomic History   Marital status: Married    Spouse name: Not on file   Number of children: Not on file   Years of education: Not on file   Highest education level: Not on file  Occupational History   Not on file  Tobacco Use   Smoking status: Former    Packs/day: 0.50    Years: 5.00    Total pack years: 2.50    Types: Cigarettes   Smokeless tobacco: Never  Vaping Use   Vaping Use: Never used  Substance and Sexual Activity   Alcohol use: Yes    Alcohol/week: 0.0 - 1.0 standard drinks of alcohol   Drug use: Never    Sexual activity: Yes    Partners: Male    Birth control/protection: Other-see comments, Post-menopausal    Comment: vasectomy  Other Topics Concern   Not on file  Social History Narrative   Not on file   Social Determinants of Health   Financial Resource Strain: Not on file  Food Insecurity: Not on file  Transportation Needs: Not on file  Physical Activity: Not on file  Stress: Not on file  Social Connections: Not on file   No Known Allergies Family History  Problem Relation Age of Onset   Thyroid cancer Father 13   Diabetes Father    Hypertension Father    Atrial fibrillation Father    Hypertension Mother    Migraines Mother    CAD Mother    Bladder Cancer Maternal Grandmother    Heart attack Maternal Grandmother 30       deceased   Pulmonary disease Maternal Grandmother    Pulmonary disease Paternal Grandfather    Diverticulitis Paternal Grandmother    Migraines Brother    Hypertension Brother    Other Paternal Aunt        mass found during endoscopy-deceased 2 days after procedure while in the hospital   Breast  cancer Paternal Aunt    Hypertension Brother     Current Outpatient Medications (Endocrine & Metabolic):    progesterone (PROMETRIUM) 200 MG capsule, Take 200 mg by mouth at bedtime.      Current Outpatient Medications (Other):    desvenlafaxine (PRISTIQ) 50 MG 24 hr tablet, Take 50 mg by mouth daily.   gabapentin (NEURONTIN) 100 MG capsule, Take 2 capsules (200 mg total) by mouth at bedtime.   NON FORMULARY, Biote - Hormone replacement palette   sertraline (ZOLOFT) 100 MG tablet, Take 150 mg by mouth daily.    VYVANSE 70 MG capsule, Take 1 capsule by mouth daily.    Review of Systems:  No headache, visual changes, nausea, vomiting, diarrhea, constipation, dizziness, abdominal pain, skin rash, fevers, chills, night sweats, weight loss, swollen lymph nodes, body aches, joint swelling, chest pain, shortness of breath, mood changes. POSITIVE muscle  aches  Objective  Blood pressure 126/88, pulse 93, height 5\' 6"  (1.676 m), weight 188 lb (85.3 kg), SpO2 98 %.   General: No apparent distress alert and oriented x3 mood and affect normal, dressed appropriately.  HEENT: Pupils equal, extraocular movements intact  Respiratory: Patient's speak in full sentences and does not appear short of breath  Cardiovascular: No lower extremity edema, non tender, no erythema  Medical exam shows the patient does have some loss of dorsiflexion of the foot.  Patient still has some very mild swelling of the fifth metatarsal.  Does have some arthritic changes noted of the midfoot.  Patient's first toe still has arthritic changes and have the hallux limitus noted.  Limited muscular skeletal ultrasound was performed and interpreted by , M  Limited ultrasound of patient's foot shows significant decrease in the hypoechoic changes of the first metatarsal.  Patient does unfortunately have hypoechoic changes also noted on the Lisfranc joint but ligament appears to be intact. Impression: Midfoot arthritis and first MTP arthritis    Impression and Recommendations:     The above documentation has been reviewed and is accurate and complete Antoine Primas, DO

## 2022-04-26 ENCOUNTER — Ambulatory Visit: Payer: 59 | Admitting: Family Medicine

## 2022-04-26 ENCOUNTER — Ambulatory Visit: Payer: Self-pay

## 2022-04-26 ENCOUNTER — Encounter: Payer: Self-pay | Admitting: Family Medicine

## 2022-04-26 VITALS — BP 126/88 | HR 93 | Ht 66.0 in | Wt 188.0 lb

## 2022-04-26 DIAGNOSIS — M7672 Peroneal tendinitis, left leg: Secondary | ICD-10-CM | POA: Diagnosis not present

## 2022-04-26 NOTE — Patient Instructions (Addendum)
See me in 3 months if you need me Please wear better shoes!

## 2022-04-26 NOTE — Assessment & Plan Note (Signed)
Significant improvement overall.  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  Encourage patient to that she needs and she is wondering again today.  We discussed which activities to do which ones to avoid, increase activity slowly.  Follow-up again as needed as long as patient does well.Jordan Graham

## 2022-07-14 ENCOUNTER — Other Ambulatory Visit: Payer: Self-pay | Admitting: Emergency Medicine

## 2022-07-14 ENCOUNTER — Encounter: Payer: 59 | Admitting: Family Medicine

## 2022-07-14 DIAGNOSIS — R911 Solitary pulmonary nodule: Secondary | ICD-10-CM

## 2022-07-19 NOTE — Progress Notes (Deleted)
[facility] [address] [place] [address] Phone: [phone] Subjective:    I'm seeing this patient by the request  of:  Jordan Moccasin, MD  CC:   [id number]  04/26/2022 Significant improvement overall.  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  Encourage patient to that she needs and she is wondering again today.  We discussed which activities to do which ones to avoid, increase activity slowly.  Follow-up again as needed as long as patient does well.Jordan Graham  Update 912/2023 Jordan Graham is a 56 y.o. female coming in with complaint of L ankle pain. Patient states        Past Medical History:  Diagnosis Date   CIN I (cervical intraepithelial neoplasia I) 1992   tx with cryo   Lichen simplex chronicus    Migraine    with menses   Patella fracture 11/06/14   broken patella-just released from PT   Past Surgical History:  Procedure Laterality Date   BREAST BIOPSY Left    ? about 25 years ago   BREAST SURGERY     GYNECOLOGIC CRYOSURGERY     Social History   Socioeconomic History   Marital status: Married    Spouse name: Not on file   Number of children: Not on file   Years of education: Not on file   Highest education level: Not on file  Occupational History   Not on file  Tobacco Use   Smoking status: Former    Packs/day: 0.50    Years: 5.00    Total pack years: 2.50    Types: Cigarettes   Smokeless tobacco: Never  Vaping Use   Vaping Use: Never used  Substance and Sexual Activity   Alcohol use: Yes    Alcohol/week: 0.0 - 1.0 standard drinks of alcohol   Drug use: Never   Sexual activity: Yes    Partners: Male    Birth control/protection: Other-see comments, Post-menopausal    Comment: vasectomy  Other Topics Concern   Not on file  Social History Narrative   Not on file   Social  Determinants of Health   Financial Resource Strain: Not on file  Food Insecurity: Not on file  Transportation Needs: Not on file  Physical Activity: Not on file  Stress: Not on file  Social Connections: Not on file   No Known Allergies Family History  Problem Relation Age of Onset   Thyroid cancer Father 57   Diabetes Father    Hypertension Father    Atrial fibrillation Father    Hypertension Mother    Migraines Mother    CAD Mother    Bladder Cancer Maternal Grandmother    Heart attack Maternal Grandmother 65       deceased   Pulmonary disease Maternal Grandmother    Pulmonary disease Paternal Grandfather    Diverticulitis Paternal Grandmother    Migraines Brother    Hypertension Brother    Other Paternal Aunt        mass found during endoscopy-deceased 2 days after procedure while in the hospital   Breast cancer Paternal Aunt    Hypertension Brother     Current Outpatient Medications (Endocrine & Metabolic):    progesterone (PROMETRIUM) 200 MG capsule, Take 200 mg by mouth at bedtime.      Current Outpatient Medications (Other):  desvenlafaxine (PRISTIQ) 50 MG 24 hr tablet, Take 50 mg by mouth daily.   gabapentin (NEURONTIN) 100 MG capsule, Take 2 capsules (200 mg total) by mouth at bedtime.   NON FORMULARY, Biote - Hormone replacement palette   sertraline (ZOLOFT) 100 MG tablet, Take 150 mg by mouth daily.    VYVANSE 70 MG capsule, Take 1 capsule by mouth daily.   Reviewed prior external information including notes and imaging from  primary care provider As well as notes that were available from care everywhere and other healthcare systems.  Past medical history, social, surgical and family history all reviewed in electronic medical record.  No pertanent information unless stated regarding to the chief complaint.   Review of Systems:  No headache, visual changes, nausea, vomiting, diarrhea, constipation, dizziness, abdominal pain, skin rash, fevers, chills,  night sweats, weight loss, swollen lymph nodes, body aches, joint swelling, chest pain, shortness of breath, mood changes. POSITIVE muscle aches  Objective  There were no vitals taken for this visit.   General: No apparent distress alert and oriented x3 mood and affect normal, dressed appropriately.  HEENT: Pupils equal, extraocular movements intact  Respiratory: Patient's speak in full sentences and does not appear short of breath  Cardiovascular: No lower extremity edema, non tender, no erythema      Impression and Recommendations:

## 2022-07-26 ENCOUNTER — Ambulatory Visit: Payer: 59 | Admitting: Family Medicine

## 2022-08-19 ENCOUNTER — Ambulatory Visit (INDEPENDENT_AMBULATORY_CARE_PROVIDER_SITE_OTHER): Payer: 59 | Admitting: Family Medicine

## 2022-08-19 ENCOUNTER — Encounter: Payer: Self-pay | Admitting: Family Medicine

## 2022-08-19 DIAGNOSIS — M19079 Primary osteoarthritis, unspecified ankle and foot: Secondary | ICD-10-CM

## 2022-08-19 NOTE — Progress Notes (Deleted)
[facility] [address] [place] [address] Phone: [phone] Subjective:    I'm seeing this patient by the request  of:  Jordan Moccasin, MD  CC:   [id number]  04/26/2022 Significant improvement overall.  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  Encourage patient to that she needs and she is wondering again today.  We discussed which activities to do which ones to avoid, increase activity slowly.  Follow-up again as needed as long as patient does well.[name]  Update 08/22/2022 Jordan Graham is a 56 y.o. female coming in with complaint of L ankle pain. Patient states      Past Medical History:  Diagnosis Date   CIN I (cervical intraepithelial neoplasia I) 1992   tx with cryo   Lichen simplex chronicus    Migraine    with menses   Patella fracture 11/06/14   broken patella-just released from PT   Past Surgical History:  Procedure Laterality Date   BREAST BIOPSY Left    ? about 25 years ago   BREAST SURGERY     GYNECOLOGIC CRYOSURGERY     Social History   Socioeconomic History   Marital status: Married    Spouse name: Not on file   Number of children: Not on file   Years of education: Not on file   Highest education level: Not on file  Occupational History   Not on file  Tobacco Use   Smoking status: Former    Packs/day: 0.50    Years: 5.00    Total pack years: 2.50    Types: Cigarettes   Smokeless tobacco: Never  Vaping Use   Vaping Use: Never used  Substance and Sexual Activity   Alcohol use: Yes    Alcohol/week: 0.0 - 1.0 standard drinks of alcohol   Drug use: Never   Sexual activity: Yes    Partners: Male    Birth control/protection: Other-see comments, Post-menopausal    Comment: vasectomy  Other Topics Concern   Not on file  Social History Narrative   Not on file   Social  Determinants of Health   Financial Resource Strain: Not on file  Food Insecurity: Not on file  Transportation Needs: Not on file  Physical Activity: Not on file  Stress: Not on file  Social Connections: Not on file   No Known Allergies Family History  Problem Relation Age of Onset   Thyroid cancer Father 37   Diabetes Father    Hypertension Father    Atrial fibrillation Father    Hypertension Mother    Migraines Mother    CAD Mother    Bladder Cancer Maternal Grandmother    Heart attack Maternal Grandmother 37       deceased   Pulmonary disease Maternal Grandmother    Pulmonary disease Paternal Grandfather    Diverticulitis Paternal Grandmother    Migraines Brother    Hypertension Brother    Other Paternal Aunt        mass found during endoscopy-deceased 2 days after procedure while in the hospital   Breast cancer Paternal Aunt    Hypertension Brother     Current Outpatient Medications (Endocrine & Metabolic):    progesterone (PROMETRIUM) 200 MG capsule, Take 200 mg by mouth at bedtime.      Current Outpatient Medications (Other):  desvenlafaxine (PRISTIQ) 50 MG 24 hr tablet, Take 50 mg by mouth daily.   gabapentin (NEURONTIN) 100 MG capsule, Take 2 capsules (200 mg total) by mouth at bedtime.   NON FORMULARY, Biote - Hormone replacement palette   sertraline (ZOLOFT) 100 MG tablet, Take 150 mg by mouth daily.    VYVANSE 70 MG capsule, Take 1 capsule by mouth daily.   Reviewed prior external information including notes and imaging from  primary care provider As well as notes that were available from care everywhere and other healthcare systems.  Past medical history, social, surgical and family history all reviewed in electronic medical record.  No pertanent information unless stated regarding to the chief complaint.   Review of Systems:  No headache, visual changes, nausea, vomiting, diarrhea, constipation, dizziness, abdominal pain, skin rash, fevers, chills,  night sweats, weight loss, swollen lymph nodes, body aches, joint swelling, chest pain, shortness of breath, mood changes. POSITIVE muscle aches  Objective  There were no vitals taken for this visit.   General: No apparent distress alert and oriented x3 mood and affect normal, dressed appropriately.  HEENT: Pupils equal, extraocular movements intact  Respiratory: Patient's speak in full sentences and does not appear short of breath  Cardiovascular: No lower extremity edema, non tender, no erythema      Impression and Recommendations:

## 2022-08-19 NOTE — Assessment & Plan Note (Signed)
Acute on chronic in nature.  Significant hallux valgus bilaterally. -Counseled on home exercise therapy and supportive care. -Orthotics with dancer pads and lateral posting's. - could consider smartcell for lower profile orthotic

## 2022-08-19 NOTE — Progress Notes (Signed)
  [facility] - 56 y.o. female MRN [id number]  Date of birth: August 02, 1966  SUBJECTIVE:  Including CC & ROS.  No chief complaint on file.   Jordan [facility] is a 56 y.o. female that is presenting with bilateral foot pain.  The pain is acute on chronic in nature.  She has tried orthotics in the past.   Review of Systems See HPI   HISTORY: Past Medical, Surgical, Social, and Family History Reviewed & Updated per EMR.   Pertinent Historical Findings include:  Past Medical History:  Diagnosis Date   CIN I (cervical intraepithelial neoplasia I) 1992   tx with cryo   Lichen simplex chronicus    Migraine    with menses   Patella fracture 11/06/14   broken patella-just released from PT    Past Surgical History:  Procedure Laterality Date   BREAST BIOPSY Left    ? about 25 years ago   BREAST SURGERY     GYNECOLOGIC CRYOSURGERY       PHYSICAL EXAM:  VS: Ht 5\' 6"  (1.676 m)   Wt 188 lb (85.3 kg)   LMP  (LMP Unknown) Comment: ~1 year ago   BMI 30.34 kg/m  Physical Exam Gen: NAD, alert, cooperative with exam, well-appearing MSK:  Neurovascularly intact    Patient was fitted for a standard, cushioned, semi-rigid orthotic. The orthotic was heated and afterward the patient stood on the orthotic blank positioned on the orthotic stand. The patient was positioned in subtalar neutral position and 10 degrees of ankle dorsiflexion in a weight bearing stance. After completion of molding, a stable base was applied to the orthotic blank. The blank was ground to a stable position for weight bearing. Size: 8 Pairs: 2 Base: Blue EVA Additional Posting and Padding: Bilateral dancer pads and lateral posting The patient ambulated these, and they were very comfortable.    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  Significant hallux valgus bilaterally. -Counseled on home exercise therapy and supportive care. -Orthotics with dancer pads and lateral  posting's. - could consider smartcell for lower profile orthotic

## 2022-08-22 ENCOUNTER — Ambulatory Visit: Payer: 59 | Admitting: Family Medicine

## 2022-09-16 ENCOUNTER — Other Ambulatory Visit: Payer: 59

## 2022-10-12 ENCOUNTER — Other Ambulatory Visit: Payer: Self-pay | Admitting: Obstetrics & Gynecology

## 2022-10-12 DIAGNOSIS — Z1231 Encounter for screening mammogram for malignant neoplasm of breast: Secondary | ICD-10-CM

## 2022-12-07 ENCOUNTER — Ambulatory Visit
Admission: RE | Admit: 2022-12-07 | Discharge: 2022-12-07 | Disposition: A | Discharge status: Home / Self Care | Payer: 59 | Source: Ambulatory Visit | Attending: Obstetrics & Gynecology | Admitting: Obstetrics & Gynecology

## 2022-12-07 DIAGNOSIS — Z1231 Encounter for screening mammogram for malignant neoplasm of breast: Secondary | ICD-10-CM

## 2023-01-19 ENCOUNTER — Telehealth: Payer: Self-pay | Admitting: Emergency Medicine

## 2023-01-19 NOTE — Telephone Encounter (Signed)
Patient would like to schedule CT scan. Last seen in 2022. Patient phone number is [phone].

## 2023-01-20 NOTE — Telephone Encounter (Signed)
Called and spoke with pt letting her know that [facility] said for her to have Super D CT ordered and then for her to have f/u with him after the CT.,  When placing order, noticed that there is already a Super D CT order in pt's chart.  [facility], please advise if you are able to schedule the Super D CT based off of this order or if a new order needs to be placed.   Also, after scheduling the scan, please schedule pt a f/u with RB about 2-3 days after scan to have results discussed. Please schedule scan next available and with the f/u with [facility], pt can be scheduled in a nodule spot for her appt. I did go ahead and put a recall in pt's chart if needed.

## 2023-01-20 NOTE — Telephone Encounter (Signed)
Yes please order super D CT chest no contrast, and have her follow w me to review

## 2023-01-20 NOTE — Telephone Encounter (Signed)
Dr. Lamonte Sakai, please advise on this if you are okay with [place] placing order for CT and if it should be a Super D CT or CT without contrast.

## 2023-01-23 NOTE — Telephone Encounter (Signed)
Spoke to pt & gave her appt info.  Gave her follow up appt with Dr Lamonte Sakai.  Nothing further needed.

## 2023-01-23 NOTE — Telephone Encounter (Addendum)
I have rescheduled CT for 4/8 at 10:40 at [facility]  Precert is still good thru 4/30.  RB's schedule has just been set up so lots of openings.  Have left a vm for pt to call me back for CT appt info and can schedule her with RB.

## 2023-02-20 ENCOUNTER — Ambulatory Visit
Admission: RE | Admit: 2023-02-20 | Discharge: 2023-02-20 | Disposition: A | Discharge status: Home / Self Care | Payer: 59 | Source: Ambulatory Visit | Attending: Emergency Medicine | Admitting: Emergency Medicine

## 2023-02-20 DIAGNOSIS — R911 Solitary pulmonary nodule: Secondary | ICD-10-CM

## 2023-02-24 ENCOUNTER — Encounter: Payer: Self-pay | Admitting: Emergency Medicine

## 2023-02-24 ENCOUNTER — Ambulatory Visit: Payer: 59 | Admitting: Emergency Medicine

## 2023-02-24 VITALS — BP 124/76 | HR 95 | Temp 98.5°F | Ht 66.0 in | Wt 203.4 lb

## 2023-02-24 DIAGNOSIS — R911 Solitary pulmonary nodule: Secondary | ICD-10-CM | POA: Diagnosis not present

## 2023-02-24 NOTE — Assessment & Plan Note (Signed)
No change in a 2 mm calcified left lower lobe pulmonary nodule between 09/2019 and 02/2023.  No other nodules noted.  Reassured her that she does not need any serial follow-up.  She can follow-up as needed

## 2023-02-24 NOTE — Progress Notes (Signed)
Subjective:    Patient ID: Jordan Graham, female    DOB: July 04, 1966, 57 y.o.   MRN: [id number]  HPI 57 year old former smoker (6-7 pk-yrs) with a history of of migraines, CIN-1, low back pain, little other past medical history.  She is referred today to review a pulmonary nodule that was found on CT scan of the chest.  She reports that she has some R upper trapezius pain / soreness, can extend down to subscapular region. Denies any SOB, can notice some restriction on size of her breath when exerting. No change w stairs, hills. She lost about 20 lbs last yr, has gained it back now.   Coronary calcium scoring CT chest performed on 10/07/2019 reviewed by me, shows a calcium score of 0.  There was a 2 mm left lower lobe pulmonary nodule that did not necessarily merit follow-up.   ROV 02/24/23 --Jordan Graham is a 57 minimal former tobacco history (7 pack years), history of migraines, CIN-1, low back pain.  I saw her in 10/22 for a small pulmonary nodule noted on coronary calcium scoring CT in November 2020.  She was concerned about the nodule and we decided to follow.  Repeat scan 02/20/2023 as below. She is well. Is working on better HTN control with her PCP. No other issues.   Super D CT chest/8/24 reviewed by me, shows an unchanged 2 mm calcified left lower lobe nodule that can be deemed benign consistent with granulomatous disease.  There are no other concerning findings.   Review of Systems As per HPI  Past Medical History:  Diagnosis Date   CIN I (cervical intraepithelial neoplasia I) 1992   tx with cryo   Lichen simplex chronicus    Migraine    with menses   Patella fracture 11/06/14   broken patella-just released from PT     Family History  Problem Relation Age of Onset   Thyroid cancer Father 39   Diabetes Father    Hypertension Father    Atrial fibrillation Father    Hypertension Mother    Migraines Mother    CAD Mother    Bladder Cancer Maternal Grandmother    Heart attack  Maternal Grandmother 35       deceased   Pulmonary disease Maternal Grandmother    Pulmonary disease Paternal Grandfather    Diverticulitis Paternal Grandmother    Migraines Brother    Hypertension Brother    Other Paternal Aunt        mass found during endoscopy-deceased 2 days after procedure while in the hospital   Breast cancer Paternal Aunt    Hypertension Brother     Her maternal aunt had lung CA (non-smoker)  Social History   Socioeconomic History   Marital status: Married    Spouse name: Not on file   Number of children: Not on file   Years of education: Not on file   Highest education level: Not on file  Occupational History   Not on file  Tobacco Use   Smoking status: Former    Packs/day: 0.50    Years: 5.00    Additional pack years: 0.00    Total pack years: 2.50    Types: Cigarettes   Smokeless tobacco: Never  Vaping Use   Vaping Use: Never used  Substance and Sexual Activity   Alcohol use: Yes    Alcohol/week: 0.0 - 1.0 standard drinks of alcohol   Drug use: Never   Sexual activity: Yes    Partners: Male  Birth control/protection: Other-see comments, Post-menopausal    Comment: vasectomy  Other Topics Concern   Not on file  Social History Narrative   Not on file   Social Determinants of Health   Financial Resource Strain: Not on file  Food Insecurity: Not on file  Transportation Needs: Not on file  Physical Activity: Not on file  Stress: Not on file  Social Connections: Not on file  Intimate Partner Violence: Not on file    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 Some exposure to asbestos [place], [place] No military No other exposures except 2nd hand smoke.   No Known Allergies   Outpatient Medications Prior to Visit  Medication Sig Dispense Refill   desvenlafaxine (PRISTIQ) 50 MG 24 hr tablet Take 50 mg by mouth daily.     sertraline (ZOLOFT) 100 MG tablet Take 150 mg by mouth daily.      valsartan (DIOVAN) 40 MG tablet Take 40 mg by mouth  daily.     VYVANSE 70 MG capsule Take 1 capsule by mouth daily.     gabapentin (NEURONTIN) 100 MG capsule Take 2 capsules (200 mg total) by mouth at bedtime. 180 capsule 0   NON FORMULARY Biote - Hormone replacement palette     progesterone (PROMETRIUM) 200 MG capsule Take 200 mg by mouth at bedtime.     No facility-administered medications prior to visit.         Objective:   Physical Exam  Vitals:   02/24/23 0833  BP: 124/76  Pulse: 95  Temp: 98.5 F (36.9 C)  TempSrc: Oral  SpO2: 98%  Weight: 203 lb 6.4 oz (92.3 kg)  Height: 5\' 6"  (1.676 m)    Gen: Pleasant, well-nourished, in no distress,  normal affect  ENT: No lesions,  mouth clear,  oropharynx clear, no postnasal drip  Neck: No JVD, no stridor  Lungs: No use of accessory muscles, no crackles or wheezing on normal respiration, no wheeze on forced expiration  Cardiovascular: RRR, heart sounds normal, no murmur or gallops, no peripheral edema  Musculoskeletal: No deformities, no cyanosis or clubbing  Neuro: alert, awake, non focal  Skin: Warm, no lesions or rash     Assessment & Plan:  Lung nodule No change in a 2 mm calcified left lower lobe pulmonary nodule between 09/2019 and 02/2023.  No other nodules noted.  Reassured her that she does not need any serial follow-up.  She can follow-up as needed   Levy Pupa, MD, PhD 02/24/2023, 8:49 AM [facility] [phone] or if no answer before 7:00PM call [phone] For any issues after 7:00PM please call eLink [phone]

## 2023-02-24 NOTE — Patient Instructions (Signed)
We reviewed your CT scan of the chest today.  There has been no change in a small 2 mm calcified left lower lobe pulmonary nodule.  This can be deemed benign.  There are no other nodules or abnormalities seen.  Great news. You do not need a dedicated repeat CT scan of the chest to follow this Please call if you have any respiratory issues, breathing difficulty so we can follow-up.

## 2023-02-27 ENCOUNTER — Encounter: Payer: Self-pay | Admitting: *Deleted

## 2023-04-20 ENCOUNTER — Other Ambulatory Visit: Payer: Self-pay | Admitting: Family Medicine

## 2023-04-20 DIAGNOSIS — E079 Disorder of thyroid, unspecified: Secondary | ICD-10-CM

## 2023-04-27 ENCOUNTER — Ambulatory Visit
Admission: RE | Admit: 2023-04-27 | Discharge: 2023-04-27 | Disposition: A | Discharge status: Home / Self Care | Payer: 59 | Source: Ambulatory Visit | Attending: Family Medicine | Admitting: Family Medicine

## 2023-04-27 DIAGNOSIS — E079 Disorder of thyroid, unspecified: Secondary | ICD-10-CM

## 2023-08-20 IMAGING — DX DG FOOT COMPLETE 3+V*L*
3 series · 3 of 3 positions shown · non-contrast
Comparison: None.

CLINICAL DATA: Foot pain, no known injury, initial encounter

EXAM:
LEFT FOOT - COMPLETE 3+ VIEW

[foot ap]
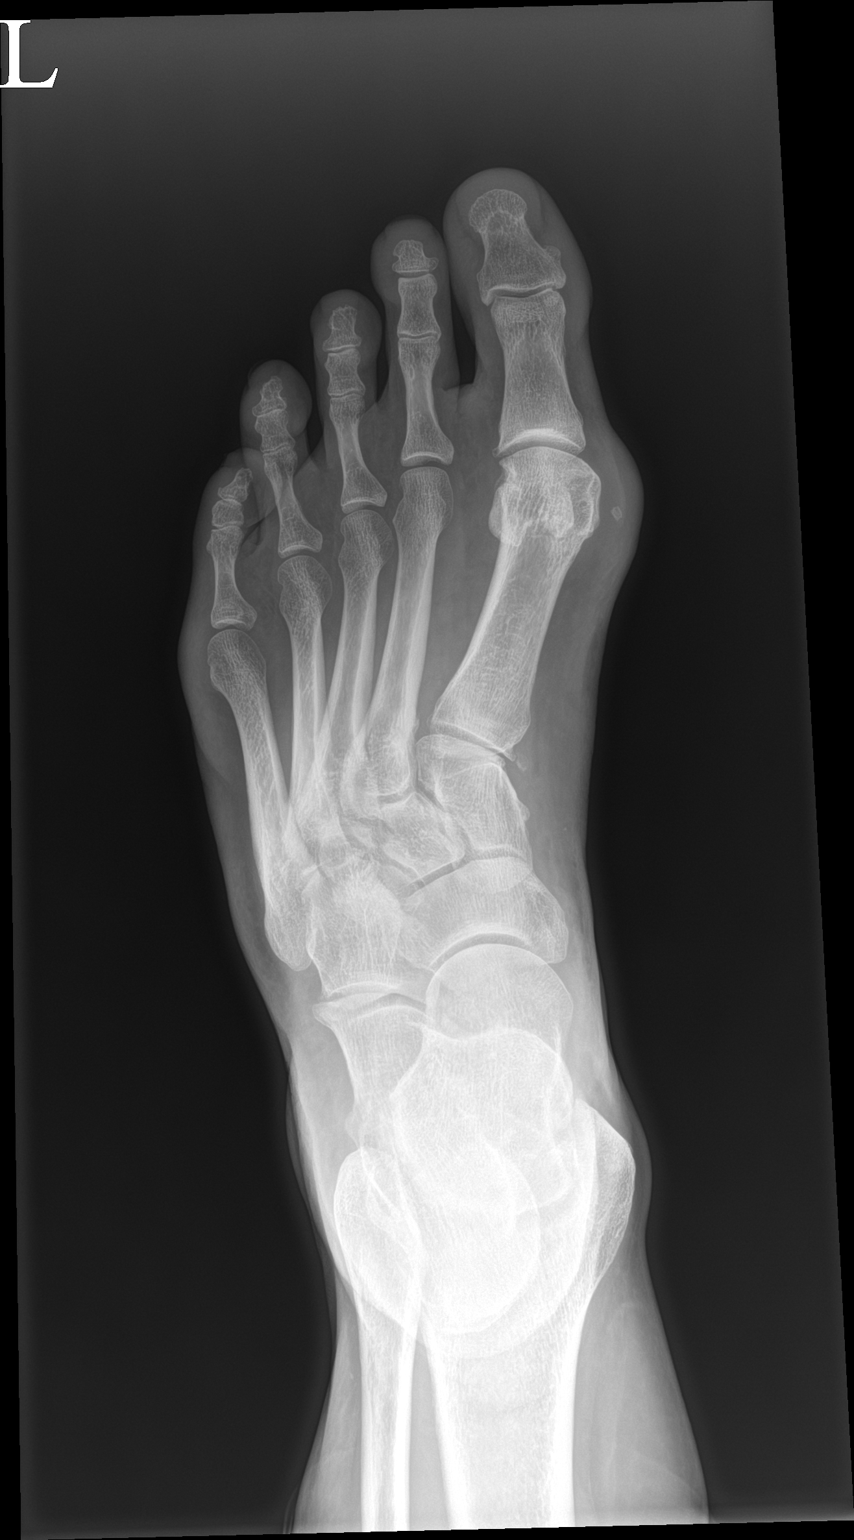

[foot obl]
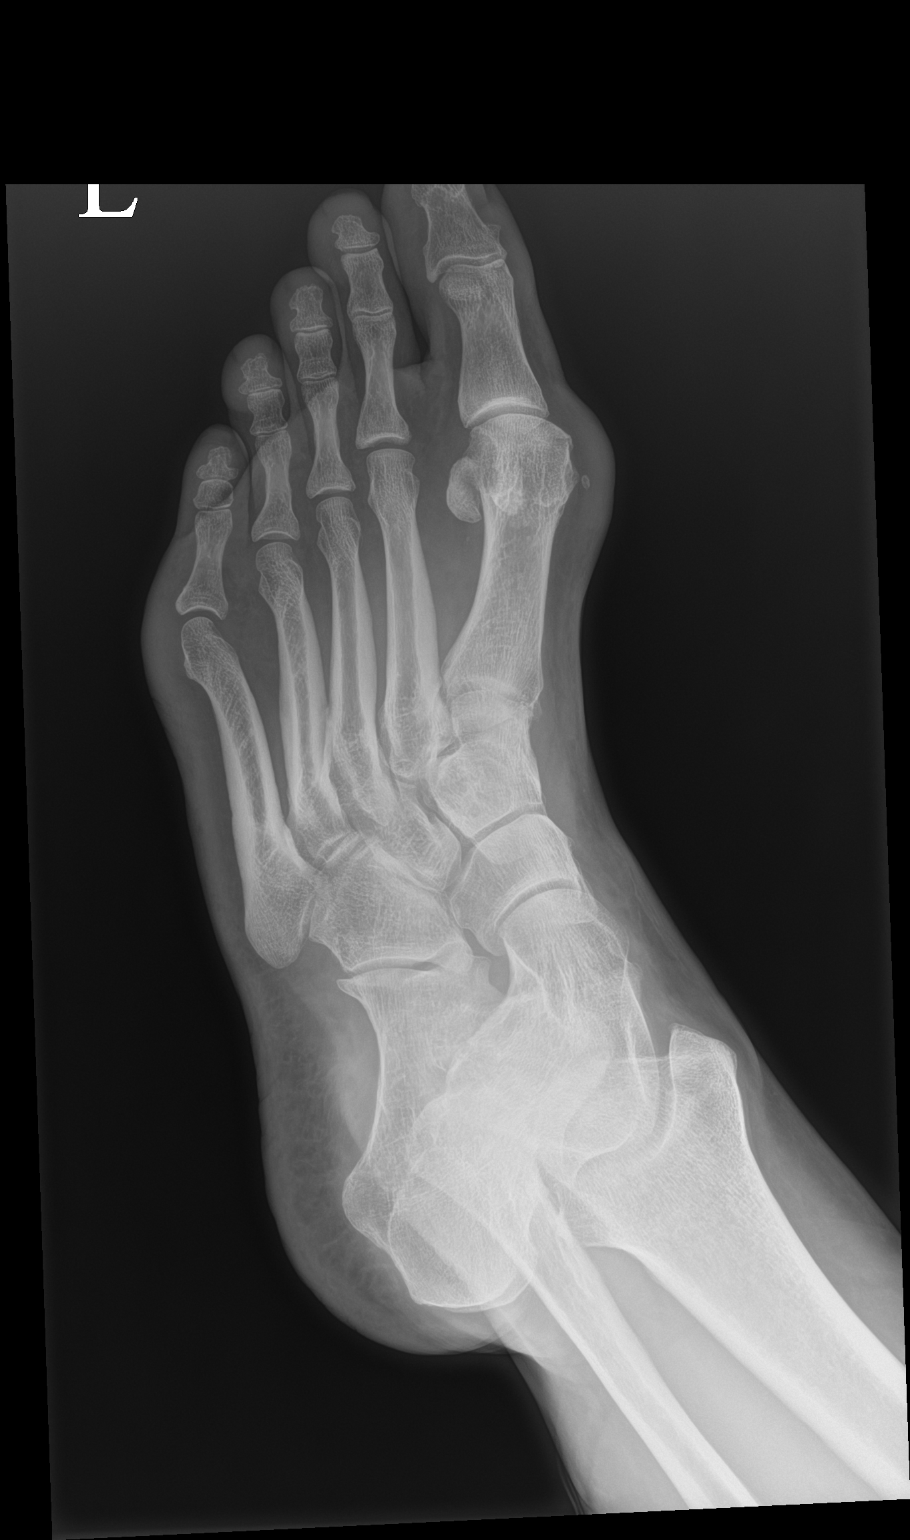

[foot lat]
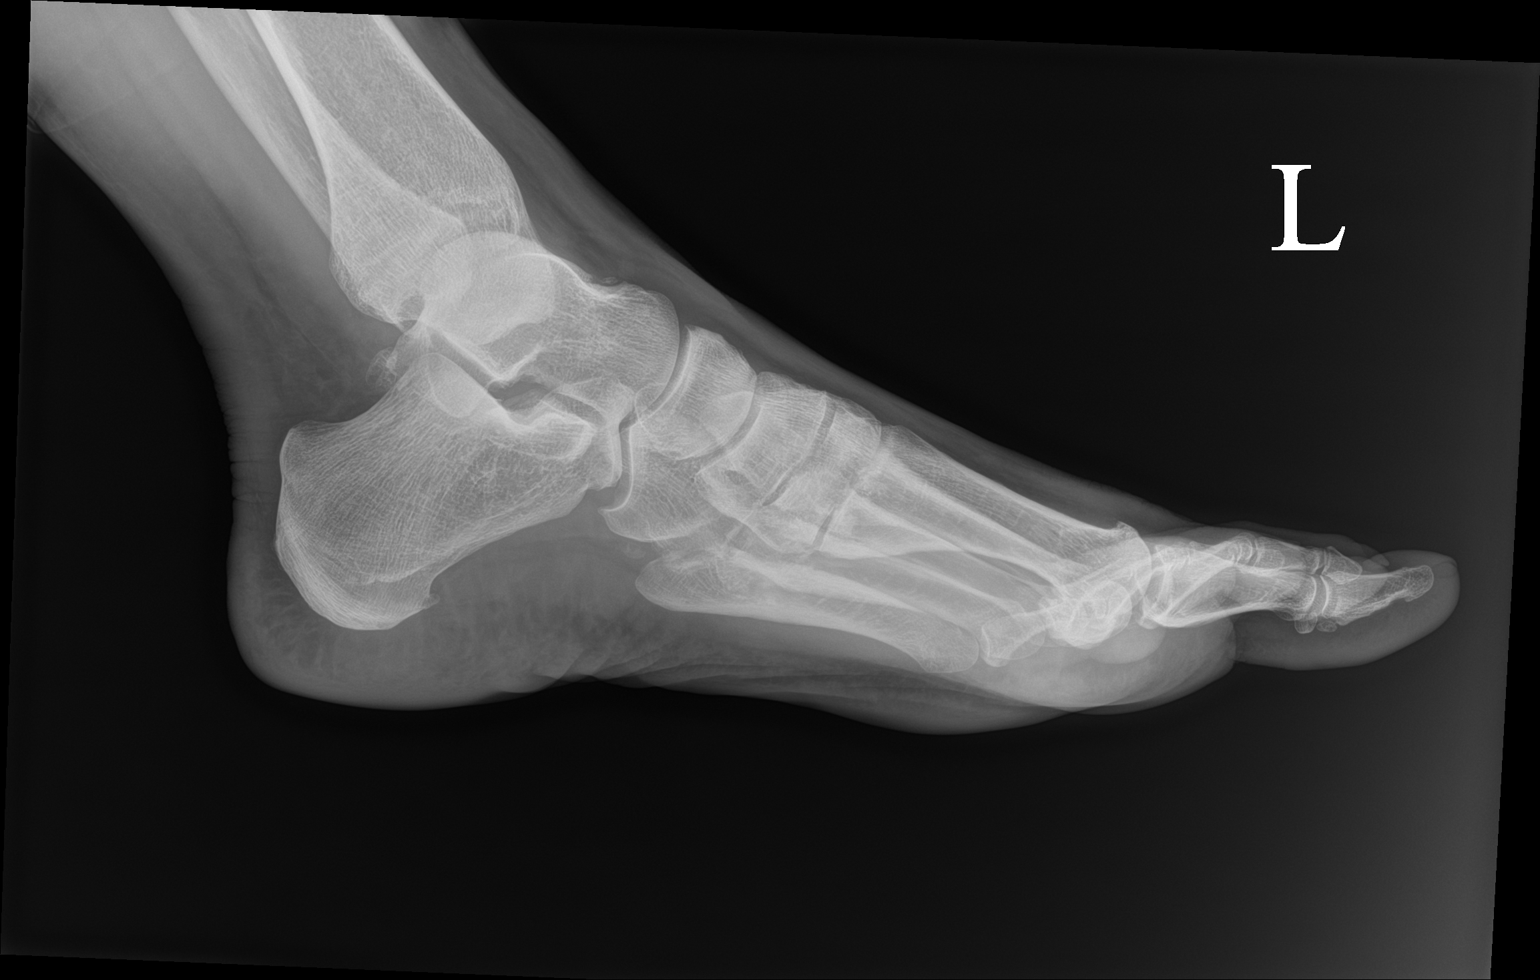

[3 of 3 positions shown; findings below may reference images not displayed]

FINDINGS: Mild hallux valgus deformity is noted. Soft tissue prominence at the
first MTP joint is noted. No acute fracture or dislocation is noted.
No other focal abnormality is seen.
IMPRESSION: Degenerative change without acute abnormality.

## 2023-08-20 IMAGING — DX DG FOOT COMPLETE 3+V*R*
3 series · 3 of 3 positions shown · non-contrast
Comparison: None.

CLINICAL DATA: Right foot pain, no known injury, initial encounter

EXAM:
RIGHT FOOT COMPLETE - 3+ VIEW

[foot ap]
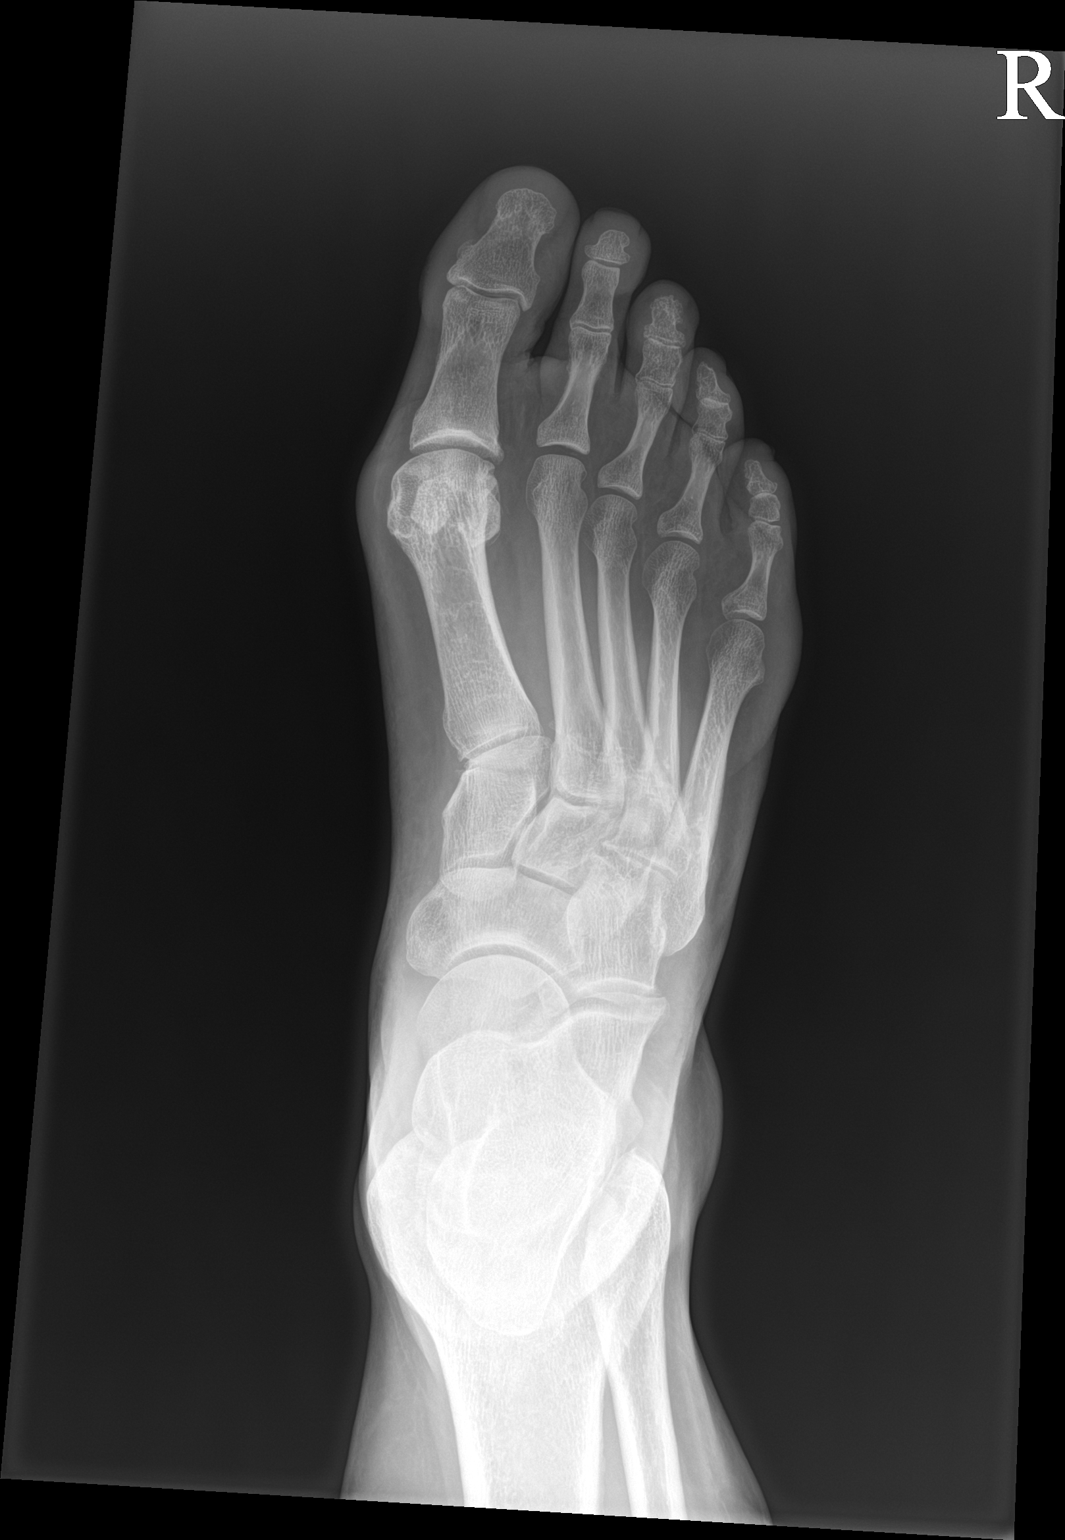

[foot obl]
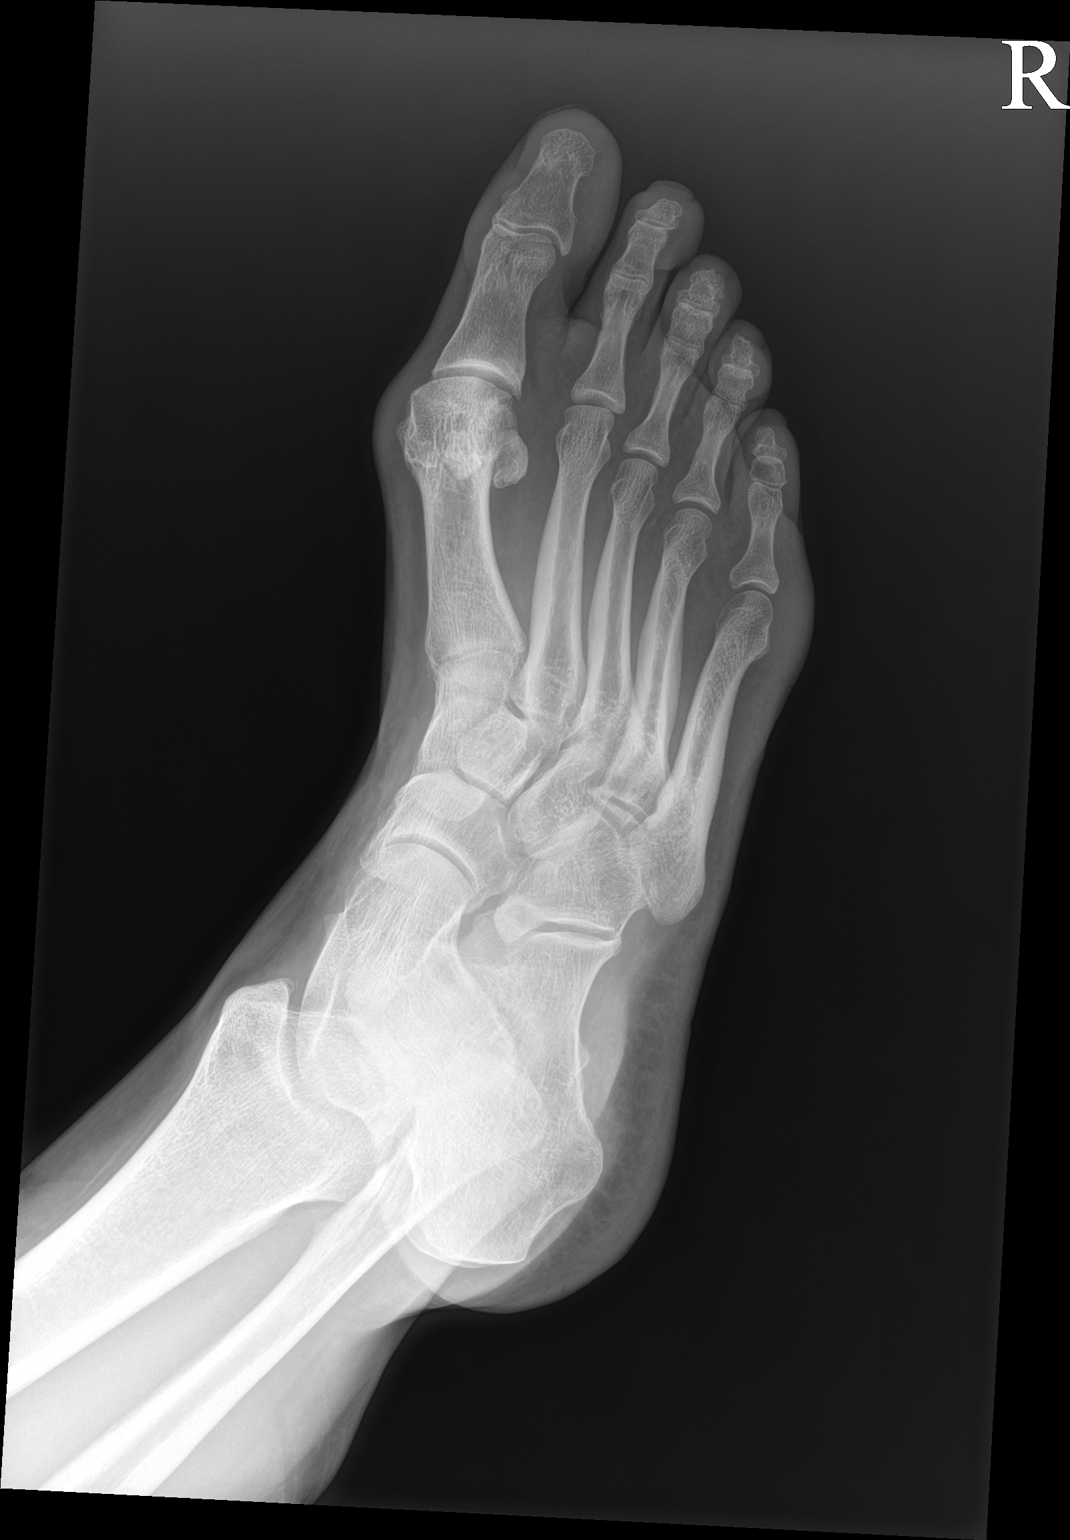

[foot lat]
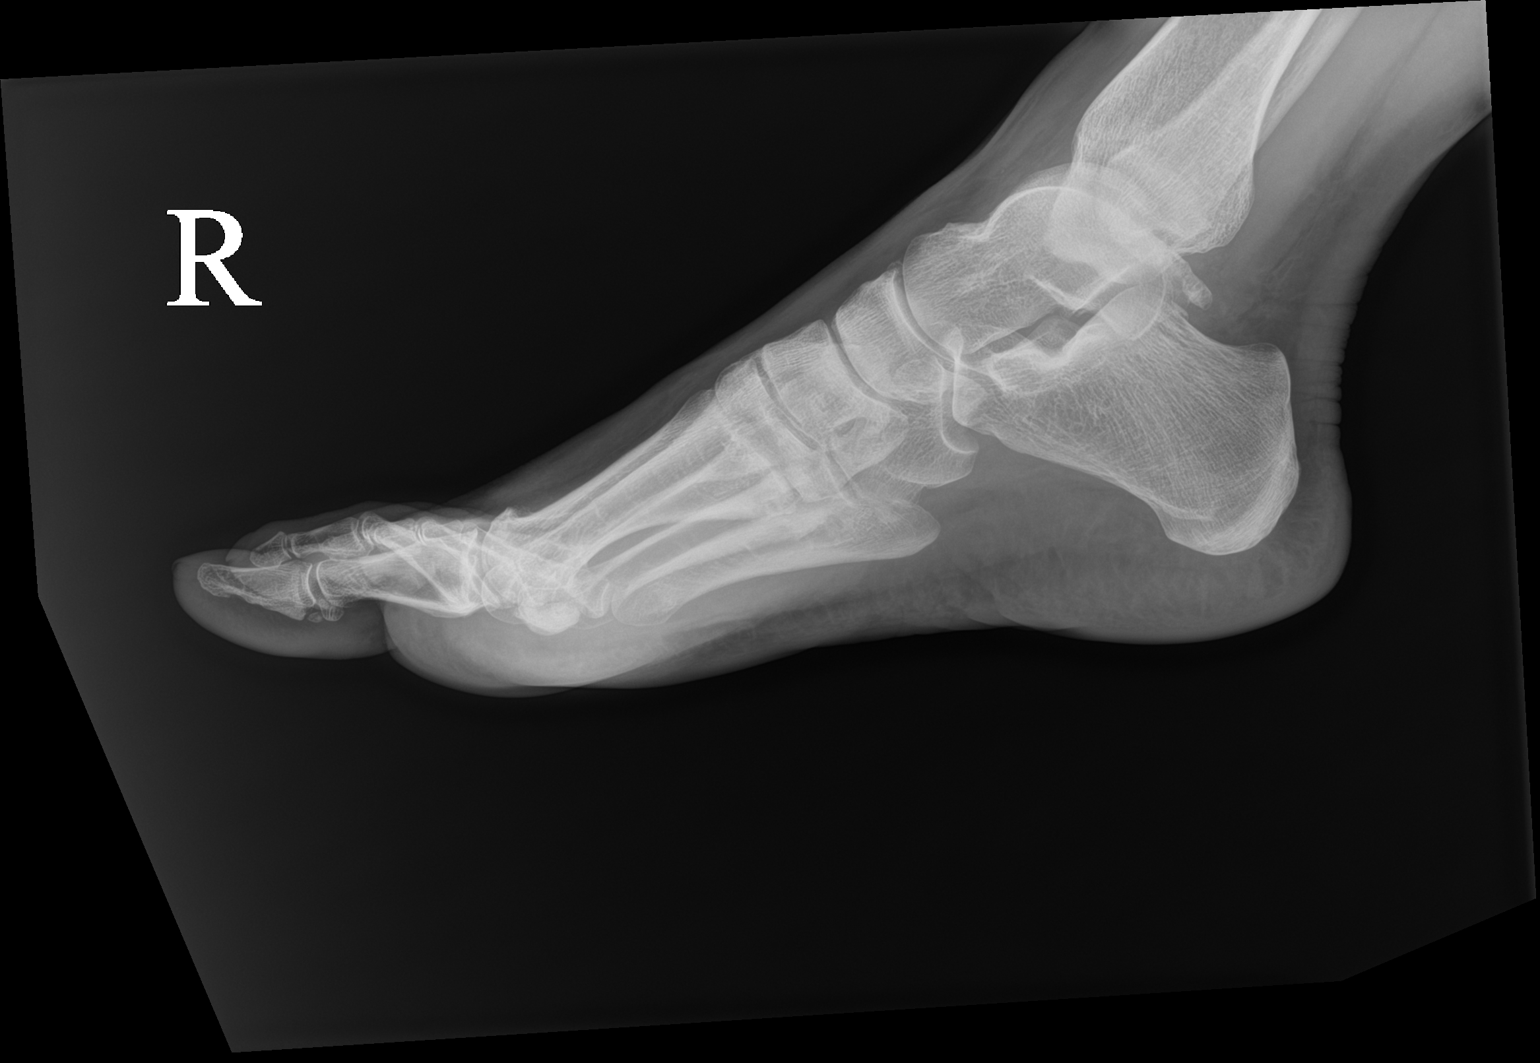

[3 of 3 positions shown; findings below may reference images not displayed]

FINDINGS: Mild hallux valgus deformity is noted. No acute fracture or
dislocation is seen. No soft tissue abnormality is noted.
IMPRESSION: Mild degenerative change without acute abnormality.

## 2023-12-12 ENCOUNTER — Telehealth: Payer: Self-pay | Admitting: Family Medicine

## 2023-12-12 NOTE — Telephone Encounter (Signed)
We rx'd custom orthotics for pt in 2023 and she saw Dr. Jordan Likes. She has had much success and wants to get more.  Can we give her another rx and advise where she should go for them?

## 2023-12-13 NOTE — Telephone Encounter (Signed)
Pt informed via VM to call for scheduling in order to get new RX for customs orthotics.

## 2024-01-24 ENCOUNTER — Other Ambulatory Visit: Payer: Self-pay | Admitting: Obstetrics & Gynecology

## 2024-01-24 DIAGNOSIS — Z1231 Encounter for screening mammogram for malignant neoplasm of breast: Secondary | ICD-10-CM

## 2024-01-25 ENCOUNTER — Ambulatory Visit
Admission: RE | Admit: 2024-01-25 | Discharge: 2024-01-25 | Disposition: A | Discharge status: Home / Self Care | Source: Ambulatory Visit | Attending: Obstetrics & Gynecology | Admitting: Obstetrics & Gynecology

## 2024-01-25 DIAGNOSIS — Z1231 Encounter for screening mammogram for malignant neoplasm of breast: Secondary | ICD-10-CM

## 2024-01-30 ENCOUNTER — Other Ambulatory Visit: Payer: Self-pay | Admitting: Obstetrics & Gynecology

## 2024-01-30 DIAGNOSIS — R928 Other abnormal and inconclusive findings on diagnostic imaging of breast: Secondary | ICD-10-CM

## 2024-02-12 ENCOUNTER — Ambulatory Visit
Admission: RE | Admit: 2024-02-12 | Discharge: 2024-02-12 | Disposition: A | Discharge status: Home / Self Care | Source: Ambulatory Visit | Attending: Obstetrics & Gynecology | Admitting: Obstetrics & Gynecology

## 2024-02-12 DIAGNOSIS — R928 Other abnormal and inconclusive findings on diagnostic imaging of breast: Secondary | ICD-10-CM

## 2024-05-06 ENCOUNTER — Ambulatory Visit (HOSPITAL_BASED_OUTPATIENT_CLINIC_OR_DEPARTMENT_OTHER): Payer: 59 | Admitting: Obstetrics & Gynecology

## 2024-05-23 ENCOUNTER — Encounter (HOSPITAL_BASED_OUTPATIENT_CLINIC_OR_DEPARTMENT_OTHER): Payer: Self-pay | Admitting: Obstetrics & Gynecology

## 2024-05-23 ENCOUNTER — Ambulatory Visit (HOSPITAL_BASED_OUTPATIENT_CLINIC_OR_DEPARTMENT_OTHER): Admitting: Obstetrics & Gynecology

## 2024-05-23 ENCOUNTER — Other Ambulatory Visit (HOSPITAL_COMMUNITY)
Admission: RE | Admit: 2024-05-23 | Discharge: 2024-05-23 | Disposition: A | Discharge status: Home / Self Care | Source: Ambulatory Visit | Attending: Obstetrics & Gynecology | Admitting: Obstetrics & Gynecology

## 2024-05-23 VITALS — BP 166/114 | HR 93 | Wt 196.0 lb

## 2024-05-23 DIAGNOSIS — Z79890 Hormone replacement therapy (postmenopausal): Secondary | ICD-10-CM

## 2024-05-23 DIAGNOSIS — R030 Elevated blood-pressure reading, without diagnosis of hypertension: Secondary | ICD-10-CM | POA: Diagnosis not present

## 2024-05-23 DIAGNOSIS — Z124 Encounter for screening for malignant neoplasm of cervix: Secondary | ICD-10-CM | POA: Diagnosis present

## 2024-05-23 DIAGNOSIS — Z01419 Encounter for gynecological examination (general) (routine) without abnormal findings: Secondary | ICD-10-CM | POA: Diagnosis not present

## 2024-05-23 DIAGNOSIS — L280 Lichen simplex chronicus: Secondary | ICD-10-CM

## 2024-05-23 DIAGNOSIS — L900 Lichen sclerosus et atrophicus: Secondary | ICD-10-CM

## 2024-05-23 DIAGNOSIS — Z23 Encounter for immunization: Secondary | ICD-10-CM | POA: Diagnosis not present

## 2024-05-23 MED ORDER — CLOBETASOL PROPIONATE 0.05 % EX OINT: 1.0000 | TOPICAL_OINTMENT | Freq: Two times a day (BID) | CUTANEOUS | 1 refills | Status: AC

## 2024-05-23 MED ORDER — HYDROCHLOROTHIAZIDE 25 MG PO TABS
25.0000 mg | ORAL_TABLET | Freq: Every day | ORAL | 0 refills | Status: DC
Start: 2024-05-23 — End: 2024-05-23

## 2024-05-23 MED ORDER — HYDROCHLOROTHIAZIDE 25 MG PO TABS: 25.0000 mg | ORAL_TABLET | Freq: Every day | ORAL | 0 refills | Status: DC

## 2024-05-23 NOTE — Progress Notes (Signed)
 ANNUAL EXAM Patient name: Jordan Graham MRN [id number]  Date of birth: 16-Mar-1966 Chief Complaint:   AEX  History of Present Illness:   Jordan Graham is a 58 y.o. G2P2 Caucasian female being seen today for a routine annual exam.  Busy with work.  Saw integrated provider who started her on HRT.  She is on best 80/20 with testosterone and progesterone  200mg  nightly.    Has been on semaglutide with functional provider.  Has lost about 7 pounds.  She is exercising regularly and being careful of her work.    Blood pressures are elevated.  She has been taking this at home and blood pressures are elevated.     No LMP recorded (lmp unknown). Patient is postmenopausal.   Last pap 05/01/2020. Results were: NILM w/ HRHPV negative. H/O abnormal pap: h/o CIN 1 Last mammogram: 01/25/2024. Results were: normal. Family h/o breast cancer: no Last colonoscopy: 01/23/2019. Results were: normal. Family h/o colorectal cancer: no.  Follow up 10 years.    Review of Systems:   Pertinent items are noted in HPI  Denies any vaginal bleeding, urinary symptoms or bowel symptoms or pelvic pain. Pertinent History Reviewed:  Reviewed past medical,surgical, social and family history.  Reviewed problem list, medications and allergies. Physical Assessment:   Vitals:   05/23/24 1405  BP: (!) 166/114  Pulse: 93  SpO2: 99%  Weight: 196 lb (88.9 kg)  Body mass index is 31.64 kg/m.        Physical Examination:   General appearance - well appearing, and in no distress  Mental status - alert, oriented to person, place, and time  Psych:  She has a normal mood and affect  Skin - warm and dry, normal color, no suspicious lesions noted  Chest - effort normal, all lung fields clear to auscultation bilaterally  Heart - normal rate and regular rhythm  Neck:  midline trachea, no thyromegaly or nodules  Breasts - breasts appear normal, no suspicious masses, no skin or nipple changes or  axillary nodes  Abdomen -  soft, nontender, nondistended, no masses or organomegaly  Pelvic - VULVA: hypopigmentation of superior labia majora with fissure present, non tender, no ulcerations   VAGINA: normal appearing vagina with normal color and discharge, no lesions   CERVIX: normal appearing cervix without discharge or lesions, no CMT  Thin prep pap is done with HR HPV cotesting  UTERUS: uterus is felt to be normal size, shape, consistency and nontender   ADNEXA: No adnexal masses or tenderness noted.  Rectal - normal rectal, good sphincter tone, no masses felt.   Extremities:  No swelling or varicosities noted  Chaperone present for exam  No results found for this or any previous visit (from the past 24 hours).  Assessment & Plan:  1. Well woman exam with routine gynecological exam (Primary) - Pap smear with HR HPV obtained today - Mammogram 01/25/2024 - Colonoscopy 01/23/2019 - lab work done not ordered - vaccines reviewed/updated.  Tdap updated today.   2. Cervical cancer screening - Cytology - PAP( [place])  3. Elevated blood pressure reading - discussed establishing care with PCP and importance of blood pressure management.  She's already done research and we discussed options.  Will start hydrochlorothiazide  as she is asymptomatic.  She will let me know if has difficulty with scheduling appt. - hydrochlorothiazide  (HYDRODIURIL ) 25 MG tablet; Take 1 tablet (25 mg total) by mouth daily.  Dispense: 90 tablet; Refill: 0  4. Lichen simplex chronicus -  will start treatment bid with clobetasol  0.05% ointment and recheck in 4-6 weeks for recheck - clobetasol  ointment (TEMOVATE ) 0.05 %; Apply 1 Application topically 2 (two) times daily. Apply as directed twice daily  Dispense: 60 g; Refill: 1  5. Hormone replacement therapy (HRT) - she is using biest and progesteron 200mg  nightly.     Orders Placed This Encounter  Procedures   Tdap vaccine greater than or equal to 7yo IM    Meds:  Meds ordered this  encounter  Medications   DISCONTD: hydrochlorothiazide  (HYDRODIURIL ) 25 MG tablet    Sig: Take 1 tablet (25 mg total) by mouth daily.    Dispense:  90 tablet    Refill:  0   clobetasol  ointment (TEMOVATE ) 0.05 %    Sig: Apply 1 Application topically 2 (two) times daily. Apply as directed twice daily    Dispense:  60 g    Refill:  1   hydrochlorothiazide  (HYDRODIURIL ) 25 MG tablet    Sig: Take 1 tablet (25 mg total) by mouth daily.    Dispense:  90 tablet    Refill:  0    Follow-up: Return in about 6 weeks (around 07/04/2024) for 6 weeks vulvar check.  Ronal GORMAN Pinal, MD 05/26/2024 9:15 PM

## 2024-05-29 ENCOUNTER — Ambulatory Visit (HOSPITAL_BASED_OUTPATIENT_CLINIC_OR_DEPARTMENT_OTHER): Payer: Self-pay | Admitting: Obstetrics & Gynecology

## 2024-05-29 LAB — CYTOLOGY - PAP
Comment: NEGATIVE
Diagnosis: NEGATIVE
High risk HPV: NEGATIVE

## 2024-07-03 ENCOUNTER — Ambulatory Visit (HOSPITAL_BASED_OUTPATIENT_CLINIC_OR_DEPARTMENT_OTHER): Admitting: Obstetrics & Gynecology

## 2024-07-03 ENCOUNTER — Encounter (HOSPITAL_BASED_OUTPATIENT_CLINIC_OR_DEPARTMENT_OTHER): Payer: Self-pay | Admitting: Obstetrics & Gynecology

## 2024-07-03 VITALS — BP 140/88 | HR 79 | Ht 66.0 in | Wt 197.0 lb

## 2024-07-03 DIAGNOSIS — L280 Lichen simplex chronicus: Secondary | ICD-10-CM

## 2024-07-03 DIAGNOSIS — L900 Lichen sclerosus et atrophicus: Secondary | ICD-10-CM

## 2024-07-03 MED ORDER — MOMETASONE FUROATE 0.1 % EX OINT: TOPICAL_OINTMENT | CUTANEOUS | 2 refills | Status: AC

## 2024-07-03 MED ORDER — NONFORMULARY OR COMPOUNDED ITEM: Status: AC

## 2024-07-03 NOTE — Progress Notes (Signed)
 GYNECOLOGY  VISIT  CC:   vulvar recheck  HPI: 58 y.o. G2P2 Married White or Caucasian female here for vulvar recheck after being seen for her annual exam about a month ago.  She has undergone vulvar biopsy in the past showing lichen simplex chronicus.  She had more hypopigmentation and was experiencing more itching.  She was started on clobetasol  twice daily.  Patient reports her symptoms are resolved.  Denies vaginal bleeding.  She did ask if I would take over management of her HRT that she has done previously with an integrative provider.  The prescriptions will be placed in epic for future reference.     Past Medical History:  Diagnosis Date   CIN I (cervical intraepithelial neoplasia I) 1992   tx with cryo   Lichen simplex chronicus    Migraine    with menses   Patella fracture 11/06/14   broken patella-just released from PT    MEDS:   Current Outpatient Medications on File Prior to Visit  Medication Sig Dispense Refill   clobetasol  ointment (TEMOVATE ) 0.05 % Apply 1 Application topically 2 (two) times daily. Apply as directed twice daily 60 g 1   hydrochlorothiazide  (HYDRODIURIL ) 25 MG tablet Take 1 tablet (25 mg total) by mouth daily. 90 tablet 0   progesterone  (PROMETRIUM ) 100 MG capsule Take 200 mg by mouth at bedtime.     Semaglutide,0.25 or 0.5MG /DOS, 2 MG/3ML SOPN Inject 0.5 mg into the skin.     sertraline (ZOLOFT) 50 MG tablet Take 50 mg by mouth every morning.     SPRAVATO, 84 MG DOSE, 28 MG/DEVICE SOPK Place into both nostrils.     venlafaxine (EFFEXOR) 50 MG tablet Take 50 mg by mouth 2 (two) times daily.     desvenlafaxine (PRISTIQ) 50 MG 24 hr tablet Take 50 mg by mouth daily. (Patient not taking: Reported on 05/23/2024)     valsartan (DIOVAN) 40 MG tablet Take 40 mg by mouth daily. (Patient not taking: Reported on 05/23/2024)     VYVANSE 70 MG capsule Take 1 capsule by mouth daily. (Patient not taking: Reported on 05/23/2024)     No current facility-administered  medications on file prior to visit.    ALLERGIES: Patient has no known allergies.  SH: Married, non-smoker  Review of Systems  Constitutional: Negative.   Genitourinary: Negative.     PHYSICAL EXAMINATION:    BP (!) 140/88   Pulse 79   Ht 5' 6 (1.676 m)   Wt 197 lb (89.4 kg)   LMP  (LMP Unknown) Comment: ~1 year ago   SpO2 100%   BMI 31.80 kg/m     General appearance: alert, cooperative and appears stated age  Lymph:  no inguinal LAD noted  Pelvic: External genitalia: Decreased hypopigmentation, no ulcerations or excoriations              Urethra:  normal appearing urethra with no masses, tenderness or lesions              Bartholins and Skenes: normal                 Chaperone was present for exam.  Assessment/Plan: 1. Lichen simplex chronicus (Primary) - She is going to decrease her clobetasol  use to nightly for the next 4 weeks.  After that time she will transition to mometasone  and try and use this twice weekly for maintenance.  Rx will be sent to pharmacy.  Written instructions provided to patient. - mometasone  (ELOCON ) 0.1 %  ointment; Apply topically twice weekly.  Dispense: 45 g; Refill: 2

## 2024-07-03 NOTE — Patient Instructions (Signed)
 Use the clobetasol  daily for another 4 weeks and then switch to the mometasone  and use it twice weekly long term.

## 2024-07-31 ENCOUNTER — Other Ambulatory Visit (HOSPITAL_BASED_OUTPATIENT_CLINIC_OR_DEPARTMENT_OTHER): Payer: Self-pay

## 2024-07-31 DIAGNOSIS — R030 Elevated blood-pressure reading, without diagnosis of hypertension: Secondary | ICD-10-CM

## 2024-07-31 MED ORDER — HYDROCHLOROTHIAZIDE 25 MG PO TABS: 25.0000 mg | ORAL_TABLET | Freq: Every day | ORAL | 0 refills | Status: AC

## 2024-07-31 NOTE — Progress Notes (Signed)
 Patient called in for a refill of the hydrochlorothiazide . According to last visit, per Dr. Cleotilde patient should get an appointment with PCP. Spoke with patient and she does have an appointment with Dr. Vickii at [facility] some time in October. Patient wasn't sure of the date as she was driving. Sent Rx in to requested pharmacy. tbw

## 2024-12-05 ENCOUNTER — Other Ambulatory Visit: Payer: Self-pay | Admitting: Internal Medicine

## 2024-12-05 DIAGNOSIS — R1011 Right upper quadrant pain: Secondary | ICD-10-CM

## 2024-12-16 ENCOUNTER — Other Ambulatory Visit

## 2024-12-17 ENCOUNTER — Ambulatory Visit
Admission: RE | Admit: 2024-12-17 | Discharge: 2024-12-17 | Disposition: A | Source: Ambulatory Visit | Attending: Internal Medicine

## 2024-12-17 DIAGNOSIS — R1011 Right upper quadrant pain: Secondary | ICD-10-CM
